# Patient Record
Sex: Male | Born: 1970 | Race: White | Hispanic: No | Marital: Married | Smoking: Never smoker
Health system: Southern US, Community
[De-identification: ages and names within clinical notes are randomized; demographics above are authoritative.]

## PROBLEM LIST (undated history)

## (undated) DIAGNOSIS — E785 Hyperlipidemia, unspecified: Secondary | ICD-10-CM

## (undated) DIAGNOSIS — K219 Gastro-esophageal reflux disease without esophagitis: Secondary | ICD-10-CM

## (undated) DIAGNOSIS — T7840XA Allergy, unspecified, initial encounter: Secondary | ICD-10-CM

## (undated) DIAGNOSIS — F32A Depression, unspecified: Secondary | ICD-10-CM

## (undated) DIAGNOSIS — M1990 Unspecified osteoarthritis, unspecified site: Secondary | ICD-10-CM

## (undated) DIAGNOSIS — K449 Diaphragmatic hernia without obstruction or gangrene: Secondary | ICD-10-CM

## (undated) DIAGNOSIS — K279 Peptic ulcer, site unspecified, unspecified as acute or chronic, without hemorrhage or perforation: Secondary | ICD-10-CM

## (undated) DIAGNOSIS — F419 Anxiety disorder, unspecified: Secondary | ICD-10-CM

## (undated) DIAGNOSIS — F329 Major depressive disorder, single episode, unspecified: Secondary | ICD-10-CM

## (undated) DIAGNOSIS — I10 Essential (primary) hypertension: Secondary | ICD-10-CM

## (undated) HISTORY — DX: Depression, unspecified: F32.A

## (undated) HISTORY — DX: Major depressive disorder, single episode, unspecified: F32.9

## (undated) HISTORY — DX: Allergy, unspecified, initial encounter: T78.40XA

## (undated) HISTORY — DX: Gastro-esophageal reflux disease without esophagitis: K21.9

## (undated) HISTORY — DX: Anxiety disorder, unspecified: F41.9

## (undated) HISTORY — PX: LIPOMA EXCISION: SHX5283

## (undated) HISTORY — PX: ESOPHAGOGASTRODUODENOSCOPY: SHX1529

## (undated) HISTORY — DX: Hyperlipidemia, unspecified: E78.5

---

## 2006-02-16 ENCOUNTER — Emergency Department: Payer: Self-pay | Admitting: Emergency Medicine

## 2006-03-01 ENCOUNTER — Emergency Department: Payer: Self-pay | Admitting: Unknown Physician Specialty

## 2006-03-01 ENCOUNTER — Other Ambulatory Visit: Payer: Self-pay

## 2006-08-09 ENCOUNTER — Other Ambulatory Visit: Payer: Self-pay

## 2006-08-09 ENCOUNTER — Emergency Department: Payer: Self-pay | Admitting: Emergency Medicine

## 2014-08-23 ENCOUNTER — Ambulatory Visit: Payer: Self-pay | Admitting: Family Medicine

## 2014-09-17 ENCOUNTER — Ambulatory Visit: Payer: Self-pay | Admitting: Family Medicine

## 2014-09-19 ENCOUNTER — Encounter: Payer: Self-pay | Admitting: Emergency Medicine

## 2014-09-19 ENCOUNTER — Emergency Department: Payer: Self-pay

## 2014-09-19 ENCOUNTER — Emergency Department
Admission: EM | Admit: 2014-09-19 | Discharge: 2014-09-19 | Disposition: A | Discharge status: Home / Self Care | Payer: Self-pay | Attending: Emergency Medicine | Admitting: Emergency Medicine

## 2014-09-19 DIAGNOSIS — R109 Unspecified abdominal pain: Secondary | ICD-10-CM

## 2014-09-19 DIAGNOSIS — X58XXXA Exposure to other specified factors, initial encounter: Secondary | ICD-10-CM | POA: Insufficient documentation

## 2014-09-19 DIAGNOSIS — Y9289 Other specified places as the place of occurrence of the external cause: Secondary | ICD-10-CM | POA: Insufficient documentation

## 2014-09-19 DIAGNOSIS — S39011A Strain of muscle, fascia and tendon of abdomen, initial encounter: Secondary | ICD-10-CM | POA: Insufficient documentation

## 2014-09-19 DIAGNOSIS — Y9389 Activity, other specified: Secondary | ICD-10-CM | POA: Insufficient documentation

## 2014-09-19 DIAGNOSIS — T148XXA Other injury of unspecified body region, initial encounter: Secondary | ICD-10-CM

## 2014-09-19 DIAGNOSIS — I10 Essential (primary) hypertension: Secondary | ICD-10-CM | POA: Insufficient documentation

## 2014-09-19 DIAGNOSIS — Y998 Other external cause status: Secondary | ICD-10-CM | POA: Insufficient documentation

## 2014-09-19 HISTORY — DX: Peptic ulcer, site unspecified, unspecified as acute or chronic, without hemorrhage or perforation: K27.9

## 2014-09-19 HISTORY — DX: Unspecified osteoarthritis, unspecified site: M19.90

## 2014-09-19 HISTORY — DX: Diaphragmatic hernia without obstruction or gangrene: K44.9

## 2014-09-19 HISTORY — DX: Essential (primary) hypertension: I10

## 2014-09-19 MED ORDER — CYCLOBENZAPRINE HCL 10 MG PO TABS: 10.0000 mg | ORAL_TABLET | Freq: Three times a day (TID) | ORAL | Status: DC | PRN

## 2014-09-19 NOTE — ED Notes (Signed)
NP at bedside.

## 2014-09-19 NOTE — ED Notes (Signed)
Pt reports sneezing about a week and a half ago and feeling a bad sharp pain in his L flank area radiating to his L lower abdomen.  Pain has persisted, but slightly better with ibuprofen.

## 2014-09-19 NOTE — Discharge Instructions (Signed)
Take medication as prescribed. Avoid overly strenuous activity. Stretch well. Limit NSAID use due to history of peptic ulcer. Alternate heat and ice for comfort.  Follow-up with your primary care physician this week as needed. Return to the ER immediately for new or worsening concerns as discussed.  Flank Pain Flank pain refers to pain that is located on the side of the body between the upper abdomen and the back. The pain may occur over a short period of time (acute) or may be long-term or reoccurring (chronic). It may be mild or severe. Flank pain can be caused by many things. CAUSES  Some of the more common causes of flank pain include:  Muscle strains.   Muscle spasms.   A disease of your spine (vertebral disk disease).   A lung infection (pneumonia).   Fluid around your lungs (pulmonary edema).   A kidney infection.   Kidney stones.   A very painful skin rash caused by the chickenpox virus (shingles).   Gallbladder disease.  HOME CARE INSTRUCTIONS  Home care will depend on the cause of your pain. In general,  Rest as directed by your caregiver.  Drink enough fluids to keep your urine clear or pale yellow.  Only take over-the-counter or prescription medicines as directed by your caregiver. Some medicines may help relieve the pain.  Tell your caregiver about any changes in your pain.  Follow up with your caregiver as directed. SEEK IMMEDIATE MEDICAL CARE IF:   Your pain is not controlled with medicine.   You have new or worsening symptoms.  Your pain increases.   You have abdominal pain.   You have shortness of breath.   You have persistent nausea or vomiting.   You have swelling in your abdomen.   You feel faint or pass out.   You have blood in your urine.  You have a fever or persistent symptoms for more than 2-3 days.  You have a fever and your symptoms suddenly get worse. MAKE SURE YOU:   Understand these instructions.  Will  watch your condition.  Will get help right away if you are not doing well or get worse. Document Released: 03/26/2005 Document Revised: 10/28/2011 Document Reviewed: 09/17/2011 [facility] Patient Information 2015 [place], [place]. This information is not intended to replace advice given to you by your health care provider. Make sure you discuss any questions you have with your health care provider.  Muscle Strain A muscle strain (pulled muscle) happens when a muscle is stretched beyond normal length. It happens when a sudden, violent force stretches your muscle too far. Usually, a few of the fibers in your muscle are torn. Muscle strain is common in athletes. Recovery usually takes 1-2 weeks. Complete healing takes 5-6 weeks.  HOME CARE   Follow the PRICE method of treatment to help your injury get better. Do this the first 2-3 days after the injury:  Protect. Protect the muscle to keep it from getting injured again.  Rest. Limit your activity and rest the injured body part.  Ice. Put ice in a plastic bag. Place a towel between your skin and the bag. Then, apply the ice and leave it on from 15-20 minutes each hour. After the third day, switch to moist heat packs.  Compression. Use a splint or elastic bandage on the injured area for comfort. Do not put it on too tightly.  Elevate. Keep the injured body part above the level of your heart.  Only take medicine as told by your  doctor.  Warm up before doing exercise to prevent future muscle strains. GET HELP IF:   You have more pain or puffiness (swelling) in the injured area.  You feel numbness, tingling, or notice a loss of strength in the injured area. MAKE SURE YOU:   Understand these instructions.  Will watch your condition.  Will get help right away if you are not doing well or get worse. Document Released: 11/12/2007 Document Revised: 11/23/2012 Document Reviewed: 09/01/2012 [facility] Patient Information 2015 [place], [place]. This  information is not intended to replace advice given to you by your health care provider. Make sure you discuss any questions you have with your health care provider.

## 2014-09-19 NOTE — ED Notes (Signed)
Patient verbalized understanding of discharge instructions given as well as prescription and need for follow up.

## 2014-09-19 NOTE — ED Provider Notes (Signed)
[facility] Emergency Department Provider Note  ____________________________________________  Time seen: Approximately 1:45 PM  I have reviewed the triage vital signs and the nursing notes.   HISTORY  Chief Complaint Flank Pain   HPI Steve Welch is a 44 y.o. male presents to the ER with spouse at bedside for the complaints of intermittent left flank pain 1 week. Patient reports that last Saturday he was sitting at the dinner table and sneezed. Patient states that he turned to his side to sneeze and states that he had a sudden onset of left flank pain at that time. Patient states that pain has been primarily with movement. Patient states that pain was resolving with over-the-counter ibuprofen. Patient states that yesterday morning he had no pain and that the pain has fully resolved but states that last night while at church he had a tickle in his throat and coughed back-to-back several times and had a re-onset of left flank pain.  Reports that current pain is 0 out of 10. Patient states the pain is only with movement. Denies fever, abdominal pain, constipation, diarrhea, dysuria, back pain, fall or injury. Patient states that when pain is present it is 3 out of 10 described as a sharp pain and catching. Denies pain radiation. States pain feels muscular.     Past Medical History  Diagnosis Date  . Hypertension   . Hiatal hernia   . Arthritis   . Peptic ulcer     There are no active problems to display for this patient.   History reviewed. No pertinent past surgical history.  Current Outpatient Rx  Name  Route  Sig  Dispense  Refill  .             Atenolol Sucralfate Omeprazole   Allergies Azithromycin  History reviewed. No pertinent family history.  Social History History  Substance Use Topics  . Smoking status: Never Smoker   . Smokeless tobacco: Not on file  . Alcohol Use: 0.6 oz/week    1 Cans of beer per week     Comment: once a week     Review of Systems Constitutional: No fever/chills Eyes: No visual changes. ENT: No sore throat. Cardiovascular: Denies chest pain. Respiratory: Denies shortness of breath. Gastrointestinal: No abdominal pain. Left flank pain with movement as above. No nausea, no vomiting.  No diarrhea.  No constipation. Genitourinary: Negative for dysuria. Musculoskeletal: Negative for back pain. Skin: Negative for rash. Neurological: Negative for headaches, focal weakness or numbness.  10-point ROS otherwise negative.  ____________________________________________   PHYSICAL EXAM:  VITAL SIGNS: ED Triage Vitals  Enc Vitals Group     BP 09/19/14 1214 143/89 mmHg     Pulse Rate 09/19/14 1214 78     Resp 09/19/14 1216 20     Temp 09/19/14 1214 98 F (36.7 C)     Temp Source 09/19/14 1214 Oral     SpO2 09/19/14 1214 96 %     Weight 09/19/14 1214 370 lb (167.831 kg)     Height 09/19/14 1214  (2.007 m)     Head Cir --      Peak Flow --      Pain Score 09/19/14 1215 4     Pain Loc --      Pain Edu? --      Excl. in GC? --     Constitutional: Alert and oriented. Well appearing and in no acute distress. Eyes: Conjunctivae are normal. PERRL. EOMI. Head: Atraumatic.  Nose:  No congestion/rhinnorhea.  Mouth/Throat: Mucous membranes are moist.  Oropharynx non-erythematous. Neck: No stridor.  No cervical spine tenderness to palpation. Hematological/Lymphatic/Immunilogical: No cervical lymphadenopathy. Cardiovascular: Normal rate, regular rhythm. Grossly normal heart sounds.  Good peripheral circulation. Respiratory: Normal respiratory effort.  No retractions. Lungs CTAB. Gastrointestinal: Soft and nontender. Obese abdomen. Normal Bowel sounds.  No abdominal bruits. No CVA tenderness.Left flank nontender to palpation. Left lateral flank mild pain with left arm overhead stretch and twisting movement. Pain only with movement.  Musculoskeletal: No lower or upper extremity tenderness nor  edema.  No joint effusions. Bilateral pedal pulses equal and easily palpated.  Neurologic:  Normal speech and language. No gross focal neurologic deficits are appreciated. No gait instability. Skin:  Skin is warm, dry and intact. No rash noted. Psychiatric: Mood and affect are normal. Speech and behavior are normal.  ____________________________________________   LABS (all labs ordered are listed, but only abnormal results are displayed)  Labs Reviewed - No data to display  RADIOLOGY  CHEST 2 VIEW  COMPARISON: None.  FINDINGS: Cardiopericardial silhouette within normal limits. Mediastinal contours normal. Trachea midline. No airspace disease or effusion. There is no pneumothorax. No displaced rib fractures are present.  IMPRESSION: No active cardiopulmonary disease.   Electronically Signed By: Andreas Newport M.D. On: 09/19/2014 13:12      I, Renford Dills, personally viewed and evaluated these images as part of my medical decision making.  ____________________________________________   INITIAL IMPRESSION / ASSESSMENT AND PLAN / ED COURSE  Pertinent labs & imaging results that were available during my care of the patient were reviewed by me and considered in my medical decision making (see chart for details).  Well-appearing patient. No acute distress. Presents the ER for complaints of 1 week times left flank pain. Patient reports that left flank pain had improved and fully resolved until last night he coughed several times back to back and had re-onset of left flank pain. Patient reports current left flank pain is 0 out of 10. Patient states that he only has left flank pain with movement. Patient abdomen soft and nontender. No CVA tenderness or posterior back pain. No pain on exam, except pain with twisting and left arm overhead stretching. Pain present only with movement.  Chest x-ray negative, no displaced rib fractures present. Discussed with patient and spouse  at bedside with him to evaluate urinalysis. Patient reports that he is on a fixed financial income without insurance and states that he does not want to give urine for testing. Patient alert and oriented with decisional capacity. Suspect muscle strain. Discussed very strict follow-up and return parameters. Patient and spouse verbalized understanding and agreed to plan. Will treat patient with when necessary Flexeril. Discussed importance of limiting NSAID use as history of peptic ulcer. Patient was understanding and agreed to plan. Verbalized understanding and return parameters. ____________________________________________   FINAL CLINICAL IMPRESSION(S) / ED DIAGNOSES  Final diagnoses:  Left flank pain  Muscle strain       Renford Dills, NP 09/19/14 1402  Darien Ramus, MD 09/19/14 [phone]

## 2015-04-04 ENCOUNTER — Telehealth: Payer: Self-pay | Admitting: Family Medicine

## 2015-04-04 NOTE — Telephone Encounter (Signed)
Please go ahead with referral to [facility].  The call back number is [phone]

## 2016-03-28 IMAGING — CR DG CHEST 2V
1 series · 2 of 2 positions shown · non-contrast
Comparison: None.

CLINICAL DATA: Rete cage pain. Fracture grade at her pain after
sneezing 1 week ago. LEFT flank pain radiating to the lower abdomen.

EXAM:
CHEST  2 VIEW

[Series 1: dg chest 2 view · 0.14mm/px · 2 of 2 slices shown]
[im 1/2]
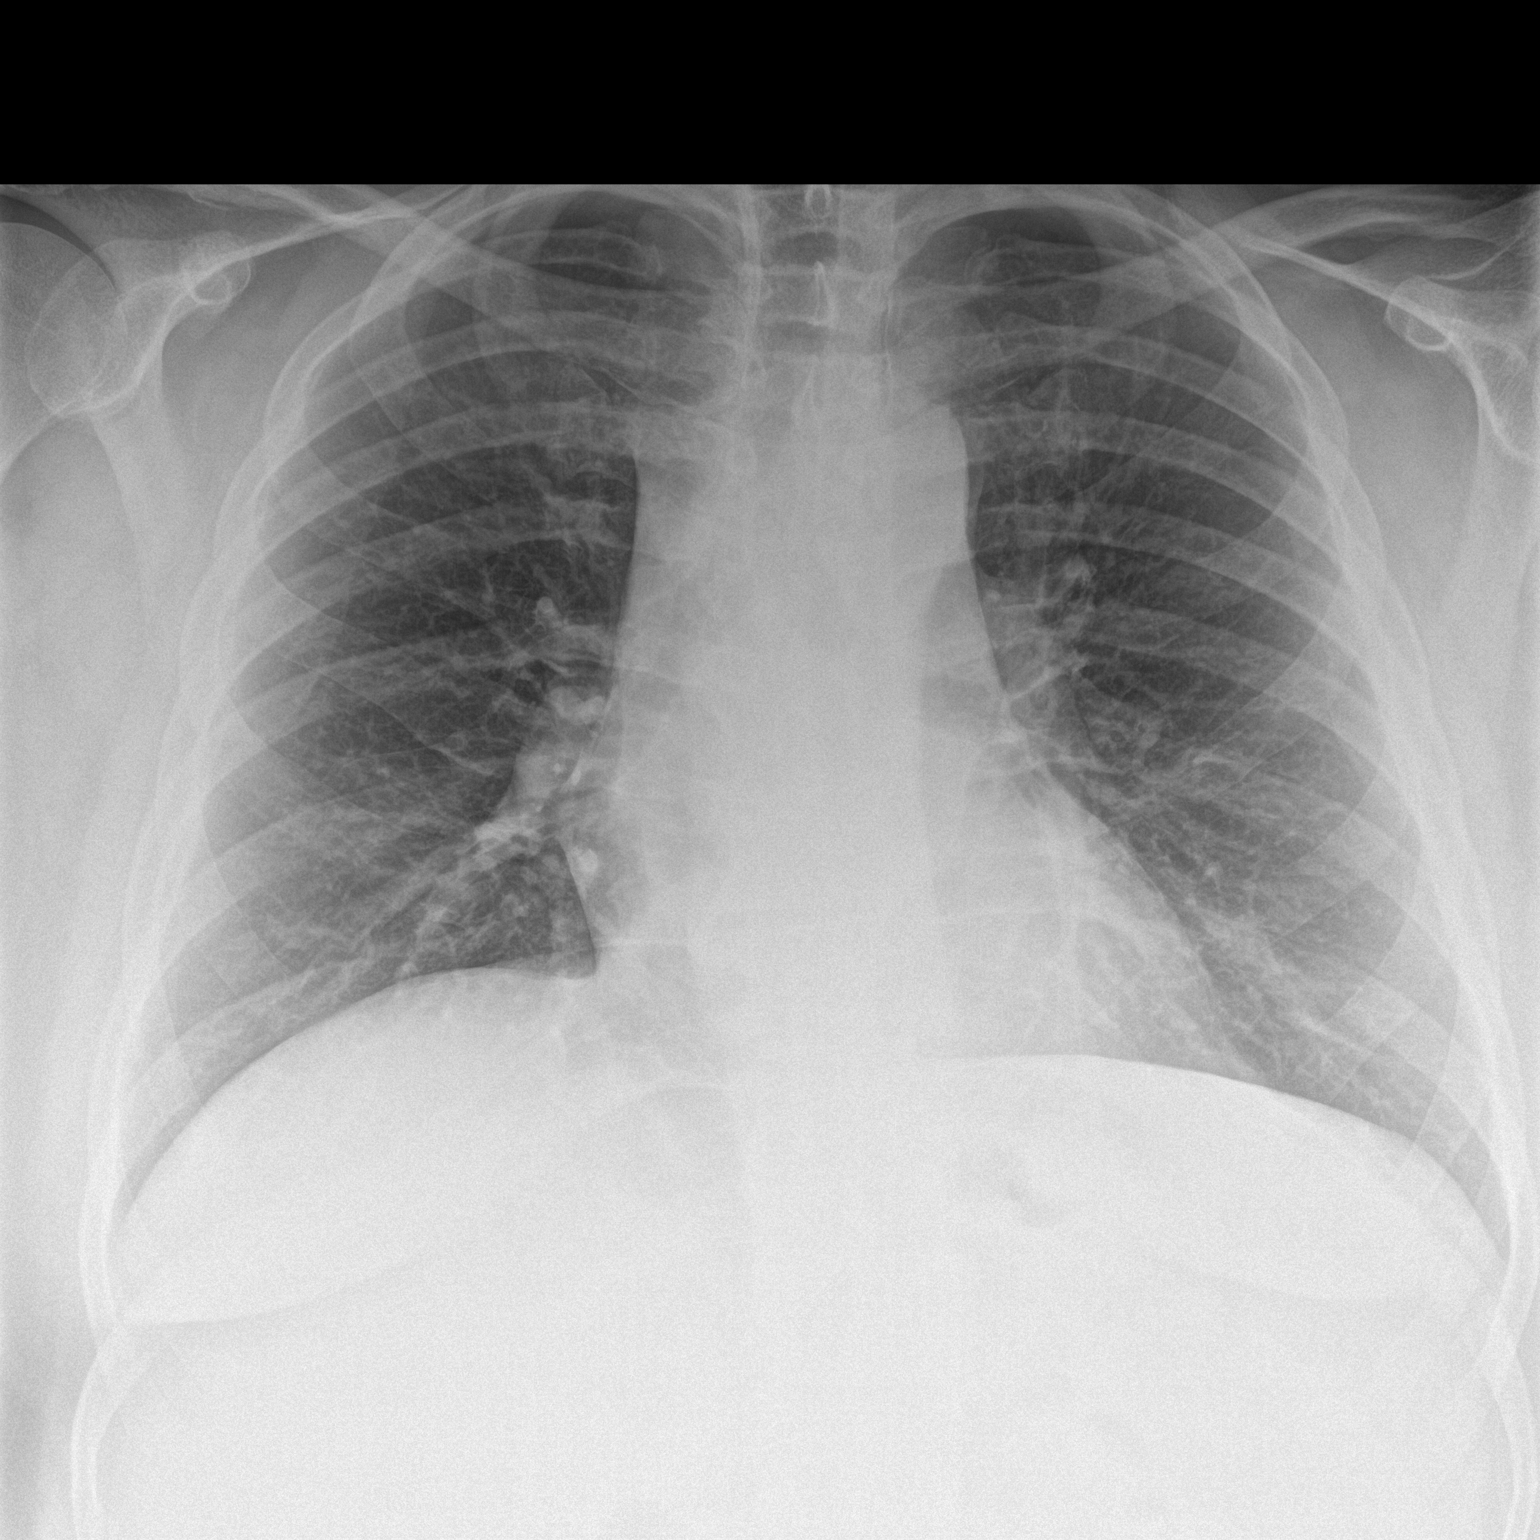
[im 2/2]
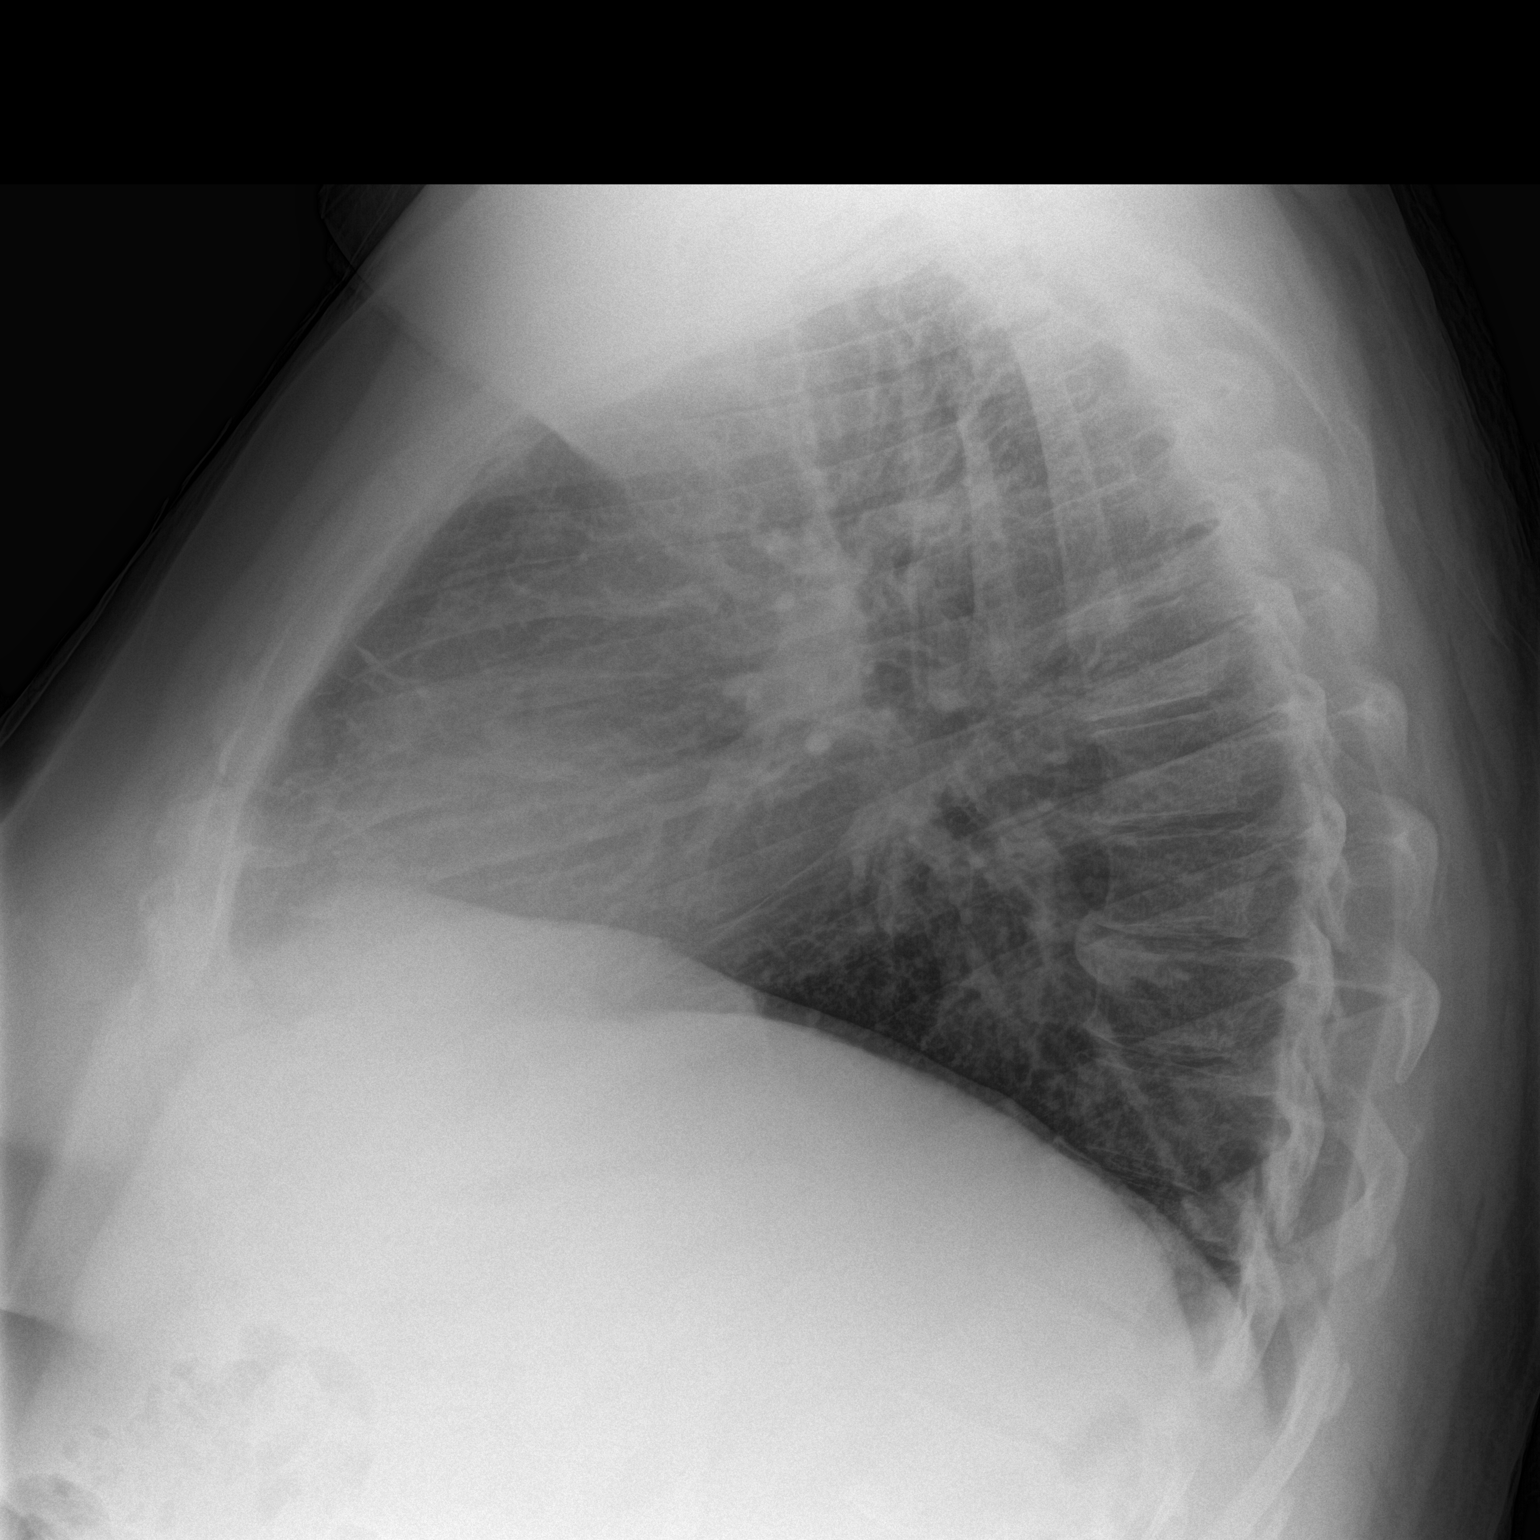

[2 of 2 positions shown; findings below may reference images not displayed]

FINDINGS: Cardiopericardial silhouette within normal limits. Mediastinal
contours normal. Trachea midline. No airspace disease or effusion.
There is no pneumothorax. No displaced rib fractures are present.
IMPRESSION: No active cardiopulmonary disease.

## 2016-06-19 ENCOUNTER — Ambulatory Visit (INDEPENDENT_AMBULATORY_CARE_PROVIDER_SITE_OTHER): Payer: Self-pay | Admitting: Nurse Practitioner

## 2016-06-19 ENCOUNTER — Encounter: Payer: Self-pay | Admitting: Nurse Practitioner

## 2016-06-19 VITALS — BP 141/81 | HR 92 | Temp 98.2°F | Resp 16 | Ht 78.0 in | Wt 339.6 lb

## 2016-06-19 DIAGNOSIS — F419 Anxiety disorder, unspecified: Secondary | ICD-10-CM

## 2016-06-19 DIAGNOSIS — M1990 Unspecified osteoarthritis, unspecified site: Secondary | ICD-10-CM

## 2016-06-19 DIAGNOSIS — F329 Major depressive disorder, single episode, unspecified: Secondary | ICD-10-CM | POA: Insufficient documentation

## 2016-06-19 DIAGNOSIS — T7840XA Allergy, unspecified, initial encounter: Secondary | ICD-10-CM | POA: Insufficient documentation

## 2016-06-19 DIAGNOSIS — F3341 Major depressive disorder, recurrent, in partial remission: Secondary | ICD-10-CM

## 2016-06-19 DIAGNOSIS — E785 Hyperlipidemia, unspecified: Secondary | ICD-10-CM | POA: Insufficient documentation

## 2016-06-19 DIAGNOSIS — K279 Peptic ulcer, site unspecified, unspecified as acute or chronic, without hemorrhage or perforation: Secondary | ICD-10-CM | POA: Insufficient documentation

## 2016-06-19 DIAGNOSIS — Z7689 Persons encountering health services in other specified circumstances: Secondary | ICD-10-CM

## 2016-06-19 DIAGNOSIS — K449 Diaphragmatic hernia without obstruction or gangrene: Secondary | ICD-10-CM | POA: Insufficient documentation

## 2016-06-19 DIAGNOSIS — I10 Essential (primary) hypertension: Secondary | ICD-10-CM

## 2016-06-19 DIAGNOSIS — M170 Bilateral primary osteoarthritis of knee: Secondary | ICD-10-CM | POA: Insufficient documentation

## 2016-06-19 DIAGNOSIS — K219 Gastro-esophageal reflux disease without esophagitis: Secondary | ICD-10-CM | POA: Insufficient documentation

## 2016-06-19 MED ORDER — ATENOLOL-CHLORTHALIDONE 100-25 MG PO TABS: 1.0000 | ORAL_TABLET | Freq: Every day | ORAL | 2 refills | Status: DC

## 2016-06-19 MED ORDER — BUSPIRONE HCL 5 MG PO TABS: 5.0000 mg | ORAL_TABLET | Freq: Two times a day (BID) | ORAL | 2 refills | Status: DC

## 2016-06-19 NOTE — Progress Notes (Signed)
Subjective:    Patient ID: Steve Welch, male    DOB: 09/28/1970, 46 y.o.   MRN: [id number]  Steve Welch is a 46 y.o. male presenting on 06/19/2016 for New Patient (Initial Visit) (Patient here to establish care, pt reports he is transferring care from [facility].)  He is accompanied today by his wife.  HPI  Hypertension Taking medications atenolol-chlorthalidone daily with no missed doses.  Usually 125-130/80 at home with home BP checks.  He gained significant weight when his mother passed away and turned to food for comfort.  He has lost about 120 lbs since that time by reducing portion sizes and reducing sugary drinks. He still drinks occasional soda 1-2 per week for a treat. Otherwise drinks flavored water or water.    Depression Draws and paints for emotional release.  Used zoloft when first diagnosed with depression. When painting is not sufficient, he "clams up into a shell and sleeps."  He is currently going through a significantly down period now.  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  Unable to hold a job.  Tried and couldn't.  "I can't provide for my family like I want to."  "I don't feel like a man anymore."  48 year old is autistic and high functioning (9-10 year).  He would love to play with him, but can't always go outside for sports because of his knee pain.  Knee pain He takes ibuprofen 400-600 mg 2-3 times daily and a glucosamine supplement with some improvement.  He doesn't notice regular relief of pain, but does have worsening pain when not taking either medication.  He also uses knee braces.  He feels unable to do anything like knee replacement surgery r/t being without health insurance.  States there are near constant grinding noises with movement and also noticing some grinding in his shoulders now.   Past Medical History:  Diagnosis Date  .  Allergy   . Anxiety   . Arthritis    knees and shoulders  . Depression   . GERD (gastroesophageal reflux disease)   . Hiatal hernia   . Hyperlipidemia   . Hypertension   . Peptic ulcer    gastric and duodenal   Past Surgical History:  Procedure Laterality Date  . ESOPHAGOGASTRODUODENOSCOPY    . LIPOMA EXCISION Left    near eye   Social History   Social History  . Marital status: Married    Spouse name: N/A  . Number of children: N/A  . Years of education: N/A   Occupational History  . Not on file.   Social History Main Topics  . Smoking status: Never Smoker  . Smokeless tobacco: Current User    Types: Snuff     Comment: started 18 x 2 years, 2010 1 can every 3 days  . Alcohol use No  . Drug use: No  . Sexual activity: Yes    Partners: Female    Birth control/ protection: None     Comment: tubal ligation   Other Topics Concern  . Not on file   Social History Narrative  . No narrative on file   Family History  Problem Relation Age of Onset  . Heart disease Mother     MI, HTN, HLD  . Diabetes Mother   . Depression Mother   . Heart disease Father     MI with  Stents  . Diabetes Maternal Grandmother   . Heart attack Paternal Grandmother   . Bladder Cancer Maternal Uncle   . Leukemia Maternal Aunt   . Colon cancer Neg Hx   . Prostate cancer Neg Hx   . Ovarian cancer Neg Hx   . Stroke Neg Hx     Depression screen [facility] 06/19/2016  Decreased Interest 1  Down, Depressed, Hopeless 1  PHQ - 2 Score 2  Altered sleeping 2  Tired, decreased energy 0  Change in appetite 1  Feeling bad or failure about yourself  3  Trouble concentrating 0  Moving slowly or fidgety/restless 1  Suicidal thoughts 0  PHQ-9 Score 9    Review of Systems Per HPI unless specifically indicated above      Objective:    BP (!) 141/81 (BP Location: Left Arm, Patient Position: Sitting, Cuff Size: Large)   Pulse 92   Temp 98.2 F (36.8 C) (Oral)   Resp 16   Ht 6\' 6"  (1.981  m)   Wt (!) 339 lb 9.6 oz (154 kg)   SpO2 97%   BMI 39.24 kg/m    BP recheck 124/78  Wt Readings from Last 3 Encounters:  06/19/16 (!) 339 lb 9.6 oz (154 kg)  09/19/14 (!) 370 lb (167.8 kg)    Physical Exam  Constitutional: He is oriented to person, place, and time. He appears well-developed and well-nourished.  significant body odor r/t poor hygiene  HENT:  Head: Normocephalic and atraumatic.  Right Ear: External ear normal.  Left Ear: External ear normal.  Mouth/Throat: Oropharynx is clear and moist.  Eyes: Conjunctivae and EOM are normal. Pupils are equal, round, and reactive to light.  Neck: Normal range of motion. No JVD present. No tracheal deviation present.  No carotid bruits  Cardiovascular: Normal rate, regular rhythm, normal heart sounds and intact distal pulses.   Pulmonary/Chest: Effort normal and breath sounds normal. No respiratory distress. He has no wheezes. He has no rales.  Abdominal: Soft. He exhibits no distension. There is no tenderness.  No hepatosplenomegaly noted with scratch test  Musculoskeletal:  Crepitus noted bilateral knees.  Pain with standing.  Difficulty/inability to climb step to get on exam table.  Lymphadenopathy:    He has no cervical adenopathy.  Neurological: He is alert and oriented to person, place, and time. He has normal reflexes.  Skin: Skin is warm. He is not diaphoretic.  Psychiatric: He has a normal mood and affect. His behavior is normal. Judgment and thought content normal. He expresses no homicidal and no suicidal ideation. He expresses no suicidal plans and no homicidal plans.   No results found for this or any previous visit.    Assessment & Plan:   Problem List Items Addressed This Visit      Cardiovascular and Mediastinum   Hypertension Stable on medications.  Tolerating well.  Requests most affordable medications available if possible.  Plan: - Continue atenolol-chlorthalidone 100-25 mg tablet take one tablet once  daily. - Obtain CMP and Lipid panel fasting labs.  Evaluate kidney function and other common comorbidities   Relevant Medications   atenolol-chlorthalidone (TENORETIC) 100-25 MG tablet   Other Relevant Orders   COMPLETE METABOLIC PANEL WITH GFR   Lipid panel     Musculoskeletal and Integument   Arthritis Severe but stable course.  Pt unable to seek additional care for knee replacements at this time.    Plan: - Check CMP for kidney function r/t chronic daily use of  max doses ibuprofen. - No additional workup at this time desired by patient.  Limited by finances.   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  Still impacting daily life.   Plan: - START buspar 5 mg once daily and increasing to BID dosing after 1 week. - Continue painting/drawing for stress relief. - Encouraged to find purpose for something in life and with son despite changing roles and inability to provide financially for his family.   Relevant Medications   busPIRone (BUSPAR) 5 MG tablet    Other Visit Diagnoses    Establishing care with new doctor, encounter for     Obtain records from former PCP at [facility]       Meds ordered this encounter  Medications  . Multiple Vitamin (MULTIVITAMIN) tablet    Sig: Take 1 tablet by mouth daily.  . Ascorbic Acid (VITAMIN C) 100 MG tablet    Sig: Take 100 mg by mouth daily.  . vitamin B-12 (CYANOCOBALAMIN) 100 MCG tablet    Sig: Take 100 mcg by mouth daily.  . vitamin E 100 UNIT capsule    Sig: Take 100 Units by mouth daily.  . Glucosamine Sulfate 1000 MG CAPS    Sig: Take by mouth.  . cetirizine (ZYRTEC) 10 MG tablet    Sig: Take 10 mg by mouth daily.  Marland Kitchen omeprazole (PRILOSEC) 20 MG capsule    Sig: Take 20 mg by mouth 2 (two) times daily before a meal.   . DISCONTD: atenolol-chlorthalidone (TENORETIC) 100-25 MG  tablet    Sig: Take 1 tablet by mouth daily.  . busPIRone (BUSPAR) 5 MG tablet    Sig: Take 1 tablet (5 mg total) by mouth 2 (two) times daily.    Dispense:  60 tablet    Refill:  2    Order Specific Question:   Supervising Provider    Answer:   Smitty Cords [[id number]]  . atenolol-chlorthalidone (TENORETIC) 100-25 MG tablet    Sig: Take 1 tablet by mouth daily.    Dispense:  30 tablet    Refill:  2    Order Specific Question:   Supervising Provider    Answer:   Smitty Cords [[id number]]      Follow up plan: Return in about 3 months (around 09/19/2016), or and as needed if new depression medications are not helping.  Wilhelmina Mcardle, DNP, AGPCNP-BC Adult Gerontology Primary Care Nurse Practitioner [facility] 06/20/2016, 9:37 PM

## 2016-06-19 NOTE — Patient Instructions (Addendum)
Steve Welch, Thank you for coming in to clinic today.  1. For your blood pressure: - Continue your current medication -   2. For your knee pain: - Start taking Tylenol extra strength 1 to 2 tablets every 8 hours for aches as needed.  Do not take more than 3,000 mg in 24 hours from all medicines.  May take Ibuprofen as well if tolerated 200-400mg  every 8 hours as needed. - Use heat and ice.  Apply this for 15 minutes at a time 6-8 times per day.   - Muscle rub with lidocaine.  Avoid using this with heat and ice to avoid burns.   3. For your depression: - CONTINUE your painting/drawing. - START buspar 5 mg once daily.  Increase to one pill twice daily after 7 days. - If there is any suicidal ideation, seek emergency care.    Please schedule a follow-up appointment with Wilhelmina Mcardle, AGNP in 3 months.  If you have any other questions or concerns, please feel free to call the clinic or send a message through MyChart. You may also schedule an earlier appointment if necessary.  Wilhelmina Mcardle, DNP, AGNP-BC Adult Gerontology Nurse Practitioner [facility], [facility]   Buspirone tablets What is this medicine? BUSPIRONE (byoo SPYE rone) is used to treat anxiety disorders. This medicine may be used for other purposes; ask your health care provider or pharmacist if you have questions. COMMON BRAND NAME(S): BuSpar What should I tell my health care provider before I take this medicine? They need to know if you have any of these conditions: -kidney or liver disease -an unusual or allergic reaction to buspirone, other medicines, foods, dyes, or preservatives -pregnant or trying to get pregnant -breast-feeding How should I use this medicine? Take this medicine by mouth with a glass of water. Follow the directions on the prescription label. You may take this medicine with or without food. To ensure that this medicine always works the same way for you, you should take it either always with  or always without food. Take your doses at regular intervals. Do not take your medicine more often than directed. Do not stop taking except on the advice of your doctor or health care professional. Talk to your pediatrician regarding the use of this medicine in children. Special care may be needed. Overdosage: If you think you have taken too much of this medicine contact a poison control center or emergency room at once. NOTE: This medicine is only for you. Do not share this medicine with others. What if I miss a dose? If you miss a dose, take it as soon as you can. If it is almost time for your next dose, take only that dose. Do not take double or extra doses. What may interact with this medicine? Do not take this medicine with any of the following medications: -linezolid -MAOIs like Carbex, Eldepryl, Marplan, Nardil, and Parnate -methylene blue -procarbazine This medicine may also interact with the following medications: -diazepam -digoxin -diltiazem -erythromycin -grapefruit juice -haloperidol -medicines for mental depression or mood problems -medicines for seizures like carbamazepine, phenobarbital and phenytoin -nefazodone -other medications for anxiety -rifampin -ritonavir -some antifungal medicines like itraconazole, ketoconazole, and voriconazole -verapamil -warfarin This list may not describe all possible interactions. Give your health care provider a list of all the medicines, herbs, non-prescription drugs, or dietary supplements you use. Also tell them if you smoke, drink alcohol, or use illegal drugs. Some items may interact with your medicine. What should I watch for  while using this medicine? Visit your doctor or health care professional for regular checks on your progress. It may take 1 to 2 weeks before your anxiety gets better. You may get drowsy or dizzy. Do not drive, use machinery, or do anything that needs mental alertness until you know how this drug affects you.  Do not stand or sit up quickly, especially if you are an older patient. This reduces the risk of dizzy or fainting spells. Alcohol can make you more drowsy and dizzy. Avoid alcoholic drinks. What side effects may I notice from receiving this medicine? Side effects that you should report to your doctor or health care professional as soon as possible: -blurred vision or other vision changes -chest pain -confusion -difficulty breathing -feelings of hostility or anger -muscle aches and pains -numbness or tingling in hands or feet -ringing in the ears -skin rash and itching -vomiting -weakness Side effects that usually do not require medical attention (report to your doctor or health care professional if they continue or are bothersome): -disturbed dreams, nightmares -headache -nausea -restlessness or nervousness -sore throat and nasal congestion -stomach upset This list may not describe all possible side effects. Call your doctor for medical advice about side effects. You may report side effects to FDA at 1-800-FDA-1088. Where should I keep my medicine? Keep out of the reach of children. Store at room temperature below 30 degrees C (86 degrees F). Protect from light. Keep container tightly closed. Throw away any unused medicine after the expiration date. NOTE: This sheet is a summary. It may not cover all possible information. If you have questions about this medicine, talk to your doctor, pharmacist, or health care provider.  2018 Elsevier/Gold Standard (2009-09-12 18:06:11)

## 2016-06-21 NOTE — Progress Notes (Signed)
I have reviewed this encounter including the documentation in this note and/or discussed this patient with the provider, Wilhelmina McardleLauren Kennedy, AGPCNP-BC. I am certifying that I agree with the content of this note as supervising physician.  Saralyn PilarAlexander Randal Yepiz, DO [facility] 06/21/2016, 9:58 AM

## 2016-09-08 ENCOUNTER — Other Ambulatory Visit: Payer: Self-pay | Admitting: Nurse Practitioner

## 2016-09-08 DIAGNOSIS — I10 Essential (primary) hypertension: Secondary | ICD-10-CM

## 2016-12-07 ENCOUNTER — Telehealth: Payer: Self-pay | Admitting: Nurse Practitioner

## 2016-12-07 DIAGNOSIS — I10 Essential (primary) hypertension: Secondary | ICD-10-CM

## 2016-12-07 MED ORDER — ATENOLOL-CHLORTHALIDONE 100-25 MG PO TABS: 1.0000 | ORAL_TABLET | Freq: Every day | ORAL | 0 refills | Status: DC

## 2016-12-07 NOTE — Telephone Encounter (Signed)
Pt [facility] [address] [facility]  He doesn't have money for copay this month but did schedule follow up for 11/29.  Her said his BP has been running around 125/80.  His call back number is [phone]

## 2017-01-14 ENCOUNTER — Ambulatory Visit: Payer: Self-pay | Admitting: Nurse Practitioner

## 2017-02-25 ENCOUNTER — Encounter: Payer: Self-pay | Admitting: Nurse Practitioner

## 2017-02-25 ENCOUNTER — Other Ambulatory Visit: Payer: Self-pay

## 2017-02-25 ENCOUNTER — Ambulatory Visit: Payer: Self-pay | Admitting: Nurse Practitioner

## 2017-02-25 DIAGNOSIS — Z6839 Body mass index (BMI) 39.0-39.9, adult: Secondary | ICD-10-CM

## 2017-02-25 DIAGNOSIS — I10 Essential (primary) hypertension: Secondary | ICD-10-CM

## 2017-02-25 MED ORDER — ATENOLOL-CHLORTHALIDONE 100-25 MG PO TABS: 1.0000 | ORAL_TABLET | Freq: Every day | ORAL | 0 refills | Status: DC

## 2017-02-25 NOTE — Patient Instructions (Addendum)
Steve Welch, Thank you for coming in to clinic today.  1. For your labs, we need to check kidney function.  2. Increase your physical activity until you are increasing your heart rate for 30 minutes on most days of the week.  DASH Eating Plan DASH stands for "Dietary Approaches to Stop Hypertension." The DASH eating plan is a healthy eating plan that has been shown to reduce high blood pressure (hypertension). It may also reduce your risk for type 2 diabetes, heart disease, and stroke. The DASH eating plan may also help with weight loss. What are tips for following this plan? General guidelines  Avoid eating more than 2,300 mg (milligrams) of salt (sodium) a day. If you have hypertension, you may need to reduce your sodium intake to 1,500 mg a day.  Limit alcohol intake to no more than 1 drink a day for nonpregnant women and 2 drinks a day for men. One drink equals 12 oz of beer, 5 oz of wine, or 1 oz of hard liquor.  Work with your health care provider to maintain a healthy body weight or to lose weight. Ask what an ideal weight is for you.  Get at least 30 minutes of exercise that causes your heart to beat faster (aerobic exercise) most days of the week. Activities may include walking, swimming, or biking.  Work with your health care provider or diet and nutrition specialist (dietitian) to adjust your eating plan to your individual calorie needs. Reading food labels  Check food labels for the amount of sodium per serving. Choose foods with less than 5 percent of the Daily Value of sodium. Generally, foods with less than 300 mg of sodium per serving fit into this eating plan.  To find whole grains, look for the word "whole" as the first word in the ingredient list. Shopping  Buy products labeled as "low-sodium" or "no salt added."  Buy fresh foods. Avoid canned foods and premade or frozen meals. Cooking  Avoid adding salt when cooking. Use salt-free seasonings or herbs instead of table  salt or sea salt. Check with your health care provider or pharmacist before using salt substitutes.  Do not fry foods. Cook foods using healthy methods such as baking, boiling, grilling, and broiling instead.  Cook with heart-healthy oils, such as olive, canola, soybean, or sunflower oil. Meal planning   Eat a balanced diet that includes: ? 5 or more servings of fruits and vegetables each day. At each meal, try to fill half of your plate with fruits and vegetables. ? Up to 6-8 servings of whole grains each day. ? Less than 6 oz of lean meat, poultry, or fish each day. A 3-oz serving of meat is about the same size as a deck of cards. One egg equals 1 oz. ? 2 servings of low-fat dairy each day. ? A serving of nuts, seeds, or beans 5 times each week. ? Heart-healthy fats. Healthy fats called Omega-3 fatty acids are found in foods such as flaxseeds and coldwater fish, like sardines, salmon, and mackerel.  Limit how much you eat of the following: ? Canned or prepackaged foods. ? Food that is high in trans fat, such as fried foods. ? Food that is high in saturated fat, such as fatty meat. ? Sweets, desserts, sugary drinks, and other foods with added sugar. ? Full-fat dairy products.  Do not salt foods before eating.  Try to eat at least 2 vegetarian meals each week.  Eat more home-cooked food and less restaurant,  buffet, and fast food.  When eating at a restaurant, ask that your food be prepared with less salt or no salt, if possible. What foods are recommended? The items listed may not be a complete list. Talk with your dietitian about what dietary choices are best for you. Grains Whole-grain or whole-wheat bread. Whole-grain or whole-wheat pasta. Brown rice. Modena Morrow. Bulgur. Whole-grain and low-sodium cereals. Pita bread. Low-fat, low-sodium crackers. Whole-wheat flour tortillas. Vegetables Fresh or frozen vegetables (raw, steamed, roasted, or grilled). Low-sodium or  reduced-sodium tomato and vegetable juice. Low-sodium or reduced-sodium tomato sauce and tomato paste. Low-sodium or reduced-sodium canned vegetables. Fruits All fresh, dried, or frozen fruit. Canned fruit in natural juice (without added sugar). Meat and other protein foods Skinless chicken or [place]. Ground chicken or [place]. Pork with fat trimmed off. Fish and seafood. Egg whites. Dried beans, peas, or lentils. Unsalted nuts, nut butters, and seeds. Unsalted canned beans. Lean cuts of beef with fat trimmed off. Low-sodium, lean deli meat. Dairy Low-fat (1%) or fat-free (skim) milk. Fat-free, low-fat, or reduced-fat cheeses. Nonfat, low-sodium ricotta or cottage cheese. Low-fat or nonfat yogurt. Low-fat, low-sodium cheese. Fats and oils Soft margarine without trans fats. Vegetable oil. Low-fat, reduced-fat, or light mayonnaise and salad dressings (reduced-sodium). Canola, safflower, olive, soybean, and sunflower oils. Avocado. Seasoning and other foods Herbs. Spices. Seasoning mixes without salt. Unsalted popcorn and pretzels. Fat-free sweets. What foods are not recommended? The items listed may not be a complete list. Talk with your dietitian about what dietary choices are best for you. Grains Baked goods made with fat, such as croissants, muffins, or some breads. Dry pasta or rice meal packs. Vegetables Creamed or fried vegetables. Vegetables in a cheese sauce. Regular canned vegetables (not low-sodium or reduced-sodium). Regular canned tomato sauce and paste (not low-sodium or reduced-sodium). Regular tomato and vegetable juice (not low-sodium or reduced-sodium). Angie Fava. Olives. Fruits Canned fruit in a light or heavy syrup. Fried fruit. Fruit in cream or butter sauce. Meat and other protein foods Fatty cuts of meat. Ribs. Fried meat. Berniece Salines. Sausage. Bologna and other processed lunch meats. Salami. Fatback. Hotdogs. Bratwurst. Salted nuts and seeds. Canned beans with added salt. Canned or  smoked fish. Whole eggs or egg yolks. Chicken or [place] with skin. Dairy Whole or 2% milk, cream, and half-and-half. Whole or full-fat cream cheese. Whole-fat or sweetened yogurt. Full-fat cheese. Nondairy creamers. Whipped toppings. Processed cheese and cheese spreads. Fats and oils Butter. Stick margarine. Lard. Shortening. Ghee. Bacon fat. Tropical oils, such as coconut, palm kernel, or palm oil. Seasoning and other foods Salted popcorn and pretzels. Onion salt, garlic salt, seasoned salt, table salt, and sea salt. Worcestershire sauce. Tartar sauce. Barbecue sauce. Teriyaki sauce. Soy sauce, including reduced-sodium. Steak sauce. Canned and packaged gravies. Fish sauce. Oyster sauce. Cocktail sauce. Horseradish that you find on the shelf. Ketchup. Mustard. Meat flavorings and tenderizers. Bouillon cubes. Hot sauce and Tabasco sauce. Premade or packaged marinades. Premade or packaged taco seasonings. Relishes. Regular salad dressings. Where to find more information:  National Heart, Lung, and [place]: https://wilson-eaton.com/  American Heart Association: www.heart.org Summary  The DASH eating plan is a healthy eating plan that has been shown to reduce high blood pressure (hypertension). It may also reduce your risk for type 2 diabetes, heart disease, and stroke.  With the DASH eating plan, you should limit salt (sodium) intake to 2,300 mg a day. If you have hypertension, you may need to reduce your sodium intake to 1,500 mg a day.  When  on the DASH eating plan, aim to eat more fresh fruits and vegetables, whole grains, lean proteins, low-fat dairy, and heart-healthy fats.  Work with your health care provider or diet and nutrition specialist (dietitian) to adjust your eating plan to your individual calorie needs. This information is not intended to replace advice given to you by your health care provider. Make sure you discuss any questions you have with your health care provider. Document  Released: 01/22/2011 Document Revised: 01/27/2016 Document Reviewed: 01/27/2016 Elsevier Interactive Patient Education  2018 Elsevier Inc.    Please schedule a follow-up appointment with Wilhelmina McardleLauren Sandeep Delagarza, AGNP. Return in about 6 months (around 08/25/2017) for Hypertension and in the next 7-10 days for labs.  If you have any other questions or concerns, please feel free to call the clinic or send a message through MyChart. You may also schedule an earlier appointment if necessary.  You will receive a survey after today's visit either digitally by e-mail or paper by [employer] mail. Your experiences and feedback matter to us.  Please respond so we know how we are doing as we provide care for you.   Wilhelmina McardleLauren Cederic Mozley, DNP, AGNP-BC Adult Gerontology Nurse Practitioner [facility], [facility]

## 2017-02-25 NOTE — Progress Notes (Signed)
Subjective:    Patient ID: Steve Welch, male    DOB: 07-Apr-1970, 47 y.o.   MRN: [id number]  Steve Welch is a 47 y.o. male presenting on 02/25/2017 for Hypertension   HPI Hypertension - He is checking BP at home or outside of clinic.  Readings around 130/80  - Current medications: atenolol-chlorthalidone 100-25 mg once daily, tolerating well without side effects - He is not currently symptomatic. - Pt denies headache, lightheadedness, dizziness, changes in vision, chest tightness/pressure, palpitations, leg swelling, sudden loss of speech or loss of consciousness.  Pt did not follow up with obtaining labs as ordered after last visit.  Cites significant financial limitations.  Class 2 Obesity Pt states he has lost weight since last visit, but review of weights shows 4 lb weight gain.  Pt reports indulgence over holidays, but stable weight since May.  Pt remembers weight loss from 2016 to now. - He  reports no regular exercise routine. Limitation related to knee pain and back pain.   - His diet is moderate in salt, moderate in fat, and moderate in carbohydrates.  Typical Meal intake: (Pt reports is about 50% what he used to eat) Breakfast: breakfast burrito, grilled cheese, or biscuit Lunch: Ham sandwich or ramen soup Dinner: Meat, 2 veg, bread, sweet tea  Social History   Tobacco Use  . Smoking status: Never Smoker  . Smokeless tobacco: Current User    Types: Snuff  . Tobacco comment: started 18 x 2 years, 2010 1 can every 3 days  Substance Use Topics  . Alcohol use: No  . Drug use: No    Review of Systems Per HPI unless specifically indicated above     Objective:    BP 134/80 (BP Location: Right Arm, Patient Position: Sitting, Cuff Size: Large)   Pulse 91   Temp 98.2 F (36.8 C) (Oral)   Ht 6\' 6"  (1.981 m)   Wt (!) 343 lb (155.6 kg)   BMI 39.64 kg/m   Wt Readings from Last 3 Encounters:  02/25/17 (!) 343 lb (155.6 kg)  06/19/16 (!) 339 lb 9.6 oz (154 kg)    09/19/14 (!) 370 lb (167.8 kg)    Physical Exam  General - Class 2 obesity with central adiposity, well-appearing, NAD HEENT - Normocephalic, atraumatic Neck - supple, non-tender, no LAD Heart - RRR, no murmurs heard Lungs - Clear throughout all lobes, no wheezing, crackles, or rhonchi. Normal work of breathing. Extremeties - non-tender, +1 pedal edema, cap refill < 2 seconds, peripheral pulses intact +2 bilaterally Skin - warm, dry Neuro - awake, alert, oriented x3, antalgic gait Psych - Normal mood and affect, normal behavior      Assessment & Plan:   Problem List Items Addressed This Visit      Cardiovascular and Mediastinum   Hypertension    Currently controlled hypertension on medications.  BP at goal.  Pt has begun to work on limiting oral intake, but is not physically active r/t osteoarthritis limitations.  Taking medications tolerating well without side effects.  - Complicating factors: obesity - No other known complications, but no additional labs obtained by pt to determine if any complications exist.  Plan: 1. Continue taking atenolol-chlorthalidone 100-25 mg once daily.  Provided only 30 days medicine r/t no labs available in last 5+ years. 2. Obtain labs BMP (only essential lab that must be collected).  3. Encouraged heart healthy diet and increasing exercise slowly to 30 minutes most days of the week. 4. Check BP 1-2  x per week at home, keep log, and bring to clinic at next appointment. 5. Follow up 6 months.        Relevant Medications   atenolol-chlorthalidone (TENORETIC) 100-25 MG tablet   Other Relevant Orders   BASIC METABOLIC PANEL WITH GFR      Meds ordered this encounter  Medications  . atenolol-chlorthalidone (TENORETIC) 100-25 MG tablet    Sig: Take 1 tablet by mouth daily.    Dispense:  30 tablet    Refill:  0    Order Specific Question:   Supervising Provider    Answer:   Smitty CordsKARAMALEGOS, ALEXANDER J [[id number]]     Follow up plan: Return in about  6 months (around 08/25/2017) for Hypertension and in the next 7-10 days for labs.  Wilhelmina McardleLauren Kori Colin, DNP, AGPCNP-BC Adult Gerontology Primary Care Nurse Practitioner [facility] 02/25/2017, 8:52 AM

## 2017-02-25 NOTE — Assessment & Plan Note (Signed)
Currently controlled hypertension on medications.  BP at goal.  Pt has begun to work on limiting oral intake, but is not physically active r/t osteoarthritis limitations.  Taking medications tolerating well without side effects.  - Complicating factors: obesity - No other known complications, but no additional labs obtained by pt to determine if any complications exist.  Plan: 1. Continue taking atenolol-chlorthalidone 100-25 mg once daily.  Provided only 30 days medicine r/t no labs available in last 5+ years. 2. Obtain labs BMP (only essential lab that must be collected).  3. Encouraged heart healthy diet and increasing exercise slowly to 30 minutes most days of the week. 4. Check BP 1-2 x per week at home, keep log, and bring to clinic at next appointment. 5. Follow up 6 months.

## 2017-03-11 ENCOUNTER — Other Ambulatory Visit: Payer: Self-pay

## 2017-03-12 ENCOUNTER — Other Ambulatory Visit: Payer: Self-pay

## 2017-04-15 ENCOUNTER — Other Ambulatory Visit: Payer: Self-pay | Admitting: Nurse Practitioner

## 2017-04-15 ENCOUNTER — Telehealth: Payer: Self-pay | Admitting: Family Medicine

## 2017-04-15 DIAGNOSIS — I10 Essential (primary) hypertension: Secondary | ICD-10-CM

## 2017-04-15 NOTE — Telephone Encounter (Signed)
[facility] [address]  His call back number is [phone]

## 2017-05-14 ENCOUNTER — Telehealth: Payer: Self-pay | Admitting: Nurse Practitioner

## 2017-05-14 ENCOUNTER — Other Ambulatory Visit: Payer: Self-pay

## 2017-05-14 DIAGNOSIS — I10 Essential (primary) hypertension: Secondary | ICD-10-CM

## 2017-05-14 MED ORDER — ATENOLOL-CHLORTHALIDONE 100-25 MG PO TABS: 1.0000 | ORAL_TABLET | Freq: Every day | ORAL | 0 refills | Status: DC

## 2017-05-14 NOTE — Telephone Encounter (Signed)
Pt needs a refill on BP medication.  He asked to hold off on lab work because he recently applied for medicaid and his wife will start a jog in mid April.Steve Welch.  His call back number is [phone]

## 2017-06-14 ENCOUNTER — Other Ambulatory Visit: Payer: Self-pay | Admitting: Nurse Practitioner

## 2017-06-14 DIAGNOSIS — I10 Essential (primary) hypertension: Secondary | ICD-10-CM

## 2017-06-14 MED ORDER — ATENOLOL-CHLORTHALIDONE 100-25 MG PO TABS: 1.0000 | ORAL_TABLET | Freq: Every day | ORAL | 0 refills | Status: DC

## 2017-06-14 NOTE — Telephone Encounter (Signed)
Pt. Wife called request refill on  Atenolol chlorthalidone Pt. Call back # is [phone]

## 2017-08-26 ENCOUNTER — Ambulatory Visit: Payer: Self-pay | Admitting: Nurse Practitioner

## 2017-12-08 ENCOUNTER — Other Ambulatory Visit: Payer: Self-pay | Admitting: Nurse Practitioner

## 2017-12-08 DIAGNOSIS — I10 Essential (primary) hypertension: Secondary | ICD-10-CM

## 2017-12-08 NOTE — Telephone Encounter (Signed)
Pt needs a refill on atenolol sent to [facility] [address]

## 2017-12-09 MED ORDER — ATENOLOL-CHLORTHALIDONE 100-25 MG PO TABS: 1.0000 | ORAL_TABLET | Freq: Every day | ORAL | 0 refills | Status: DC

## 2017-12-16 ENCOUNTER — Ambulatory Visit: Payer: Medicaid Other | Admitting: Nurse Practitioner

## 2018-03-24 ENCOUNTER — Ambulatory Visit: Payer: Self-pay | Admitting: Nurse Practitioner

## 2018-03-24 ENCOUNTER — Encounter: Payer: Self-pay | Admitting: Nurse Practitioner

## 2018-03-24 ENCOUNTER — Other Ambulatory Visit: Payer: Self-pay

## 2018-03-24 VITALS — BP 123/84 | HR 89 | Temp 97.8°F | Ht 78.0 in | Wt 340.5 lb

## 2018-03-24 DIAGNOSIS — M172 Bilateral post-traumatic osteoarthritis of knee: Secondary | ICD-10-CM

## 2018-03-24 DIAGNOSIS — N529 Male erectile dysfunction, unspecified: Secondary | ICD-10-CM | POA: Insufficient documentation

## 2018-03-24 DIAGNOSIS — F419 Anxiety disorder, unspecified: Secondary | ICD-10-CM

## 2018-03-24 DIAGNOSIS — Z6839 Body mass index (BMI) 39.0-39.9, adult: Secondary | ICD-10-CM

## 2018-03-24 DIAGNOSIS — I10 Essential (primary) hypertension: Secondary | ICD-10-CM

## 2018-03-24 MED ORDER — ATENOLOL-CHLORTHALIDONE 100-25 MG PO TABS
1.0000 | ORAL_TABLET | Freq: Every day | ORAL | 5 refills | Status: DC
Start: 2018-03-24 — End: 2018-09-09

## 2018-03-24 MED ORDER — BUSPIRONE HCL 5 MG PO TABS
5.0000 mg | ORAL_TABLET | Freq: Two times a day (BID) | ORAL | 5 refills | Status: DC
Start: 2018-03-24 — End: 2018-09-09

## 2018-03-24 MED ORDER — MELOXICAM 15 MG PO TABS: 15.0000 mg | ORAL_TABLET | Freq: Every day | ORAL | 0 refills | Status: DC

## 2018-03-24 NOTE — Progress Notes (Signed)
Subjective:    Patient ID: Steve Welch, male    DOB: [birth year], 48 y.o.   MRN: [id number]  Steve Welch is a 48 y.o. male presenting on 03/24/2018 for Hypertension and Knee Pain (chronic bilateral knee pain. )   HPI Hypertension - He is not checking BP at home or outside of clinic.    - Current medications: atenolol-chlorthalidone 100-25 mg once daily, tolerating well without side effects - He is not currently symptomatic. - Pt denies headache, lightheadedness, dizziness, changes in vision, chest tightness/pressure, palpitations, leg swelling, sudden loss of speech or loss of consciousness. - He  reports no regular exercise routine. - His diet is moderate in salt, moderate in fat, and moderate in carbohydrates.  - Labs not collected at last visit due to cost concerns. Patient unaware of [facility] financial assistance application.   Chronic bilateral knee pain Patient reports continued chronic, severe pain in bilateral knees.  Patient has long-standing history of morbid obesity, but this is improved over last several years.  Patient weighed 514 lbs at heaviest weight. Patient continues to lose weight and is now at 340 lbs (335-340 usually and feels stuck). - Tylenol and ibuprofen continues without significant relief. - For regular pain management, patient has gotten a scooter for mobility. - Continues to wear bilateral knee braces daily for increasing stability. Patient has done this for several years at this point in time.  Depression/situational anxiety Continues drawing/painting for mood stability.  Received large canvas at Christmas he is currently painting.   Continues buspirone 5 mg bid with moderate improvement.  Depression continues to revolve around feeling like he cannot contribute meaningfully financially to his household and cannot be the father that he wishes he could due to limitations in activities.  Erectile Dysfunction Patient reports difficulty with obtaining full  erection during sexual intercourse. He requests information about what is required to start sildenafil. He has normal libido, but is unable to obtain firm erection.  He continues to have morning erection some days.  Admits increased stress, other partner medical issues as other reasons for decreased efforts recently to participate in sexual activity.  Patient continues in a mutually monogamous relationship.   [facility] 03/24/2018 06/19/2016  Decreased Interest 1 1  Down, Depressed, Hopeless 2 1  PHQ - 2 Score 3 2  Altered sleeping 3 2  Tired, decreased energy 1 0  Change in appetite 0 1  Feeling bad or failure about yourself  3 3  Trouble concentrating 0 0  Moving slowly or fidgety/restless 0 1  Suicidal thoughts 0 0  PHQ-9 Score 10 9  Difficult doing work/chores Not difficult at all -     Social History   Tobacco Use  . Smoking status: Never Smoker  . Smokeless tobacco: Current User    Types: Snuff  . Tobacco comment: started 18 x 2 years, 2010 1 can every 3 days  Substance Use Topics  . Alcohol use: No  . Drug use: No    Review of Systems Per HPI unless specifically indicated above     Objective:    BP 123/84 (BP Location: Left Arm, Patient Position: Sitting, Cuff Size: Large)   Pulse 89   Temp 97.8 F (36.6 C) (Oral)   Ht 6\' 6"  (1.981 m)   Wt (!) 340 lb 8 oz (154.4 kg)   BMI 39.35 kg/m   Wt Readings from Last 3 Encounters:  03/24/18 (!) 340 lb 8 oz (154.4 kg)  02/25/17 (!) 343 lb (  155.6 kg)  06/19/16 (!) 339 lb 9.6 oz (154 kg)    Physical Exam Vitals signs reviewed.  Constitutional:      General: He is awake. He is not in acute distress.    Appearance: He is well-developed.  HENT:     Head: Normocephalic and atraumatic.  Neck:     Musculoskeletal: Normal range of motion and neck supple.     Vascular: No carotid bruit.  Cardiovascular:     Rate and Rhythm: Normal rate and regular rhythm.     Pulses:          Radial pulses are 2+ on the right  side and 2+ on the left side.       Posterior tibial pulses are 1+ on the right side and 1+ on the left side.     Heart sounds: Normal heart sounds, S1 normal and S2 normal.  Pulmonary:     Effort: Pulmonary effort is normal. No respiratory distress.     Breath sounds: Normal breath sounds and air entry.  Musculoskeletal:     Comments: Bilateral Knees Inspection: Normal appearance and symmetrical. No ecchymosis or effusion.  Patient is wearing bilateral knee braces and wears daily/all day. Palpation: moderately TTP bilateral knees of joint space and surrounding soft tissue/muscles.  Crepitus bilaterally through full ROM.   ROM: Full active ROM bilaterally Special Testing: Lachman / Valgus/Varus tests negative with intact ligaments (ACL, MCL, LCL). McMurray positive with meniscus symptoms. Strength: 3/5 intact knee flex/ext, ankle dorsi/plantarflex Neurovascular: distally intact sensation light touch and pulses   Skin:    General: Skin is warm and dry.  Neurological:     Mental Status: He is alert and oriented to person, place, and time.  Psychiatric:        Attention and Perception: Attention normal.        Mood and Affect: Mood and affect normal.        Behavior: Behavior normal. Behavior is cooperative.       Assessment & Plan:   Problem List Items Addressed This Visit      Cardiovascular and Mediastinum   Hypertension - Primary Controlled hypertension.  BP goal < 130/80.  Pt is working on lifestyle modifications to obtain more weight loss. No regular physical activity due to knee limitations  Taking medications tolerating well without side effects. Complication: patient has not obtained labs as requested at last visit.  Plan: 1. Continue taking atenolol-chlorthalidone 100-25 mg once daily.  BP at goal. 2. Obtain labs today before leaving clinic.  MUST have BMP.  Offered patient options for accessible clinics that cover labs for uninsured patients, refused transition.  Reinforced  to patient the need for medical safety of drugs to have labs today.  Verbalized understanding. 3. Encouraged heart healthy diet and increasing exercise to 30 minutes most days of the week. 4. Check BP 1-2 x per week at home, keep log, and bring to clinic at next appointment. 5. Follow up 6 months.     Relevant Medications   atenolol-chlorthalidone (TENORETIC) 100-25 MG tablet   Other Relevant Orders   BASIC METABOLIC PANEL WITH GFR     Musculoskeletal and Integument   Osteoarthritis of both knees Chronic, stable, severe arthritis by exam.  No imaging performed in past.  Unable to access imaging due to cost at this time.  Patient also declines orthopedic referral at this time.  Encouraged weight loss to improve symptoms.  Start knee strengthening exercises.  Advised to have exercises performed without  knee brace for muscle strengthening/joint support. Follow-up 6 months prn.     Relevant Medications   meloxicam (MOBIC) 15 MG tablet   Other Relevant Orders   BASIC METABOLIC PANEL WITH GFR     Other   Anxiety Stable, slightly improved until last week with additional situational stressor.  Continues on buspirone 5 mg bid.  Follow-up 6 months.   Relevant Medications   busPIRone (BUSPAR) 5 MG tablet   Erectile dysfunction Chronic problem for patient.  Likely due to multiple factors that cannot be fully evaluated medically at this time due to cost.  Medication: atenolol.  Unlikely due to low testosterone based on symptoms reported.  Plan: 1. Offered option to start sildenafil.  Reviewed side effects of possible priapism and need to avoid all nitrates if chest pain occurs within 24-48 hrs of taking medication. 2. Patient states he will call clinic if he decides to start sildenafil.   Side effects and cost are concern currently. 3. Follow-up prn.      Meds ordered this encounter  Medications  . meloxicam (MOBIC) 15 MG tablet    Sig: Take 1 tablet (15 mg total) by mouth daily.    Dispense:   30 tablet    Refill:  0    Order Specific Question:   Supervising Provider    Answer:   Smitty Cords [[id number]]  . busPIRone (BUSPAR) 5 MG tablet    Sig: Take 1 tablet (5 mg total) by mouth 2 (two) times daily.    Dispense:  60 tablet    Refill:  5    Order Specific Question:   Supervising Provider    Answer:   Smitty Cords [[id number]]  . atenolol-chlorthalidone (TENORETIC) 100-25 MG tablet    Sig: Take 1 tablet by mouth daily.    Dispense:  30 tablet    Refill:  5    Order Specific Question:   Supervising Provider    Answer:   Smitty Cords [[id number]]    Follow up plan: Return in about 6 months (around 09/22/2018) for hypertension.  Wilhelmina Mcardle, DNP, AGPCNP-BC Adult Gerontology Primary Care Nurse Practitioner [facility] 03/24/2018, 9:01 AM

## 2018-03-24 NOTE — Patient Instructions (Addendum)
Steve Welch,   Thank you for coming in to clinic today.  1. You have chronic knee degeneration both joints. - Start taking Tylenol extra strength 1 to 2 tablets every 6-8 hours for aches or fever/chills for next few days as needed.  Do not take more than 3,000 mg in 24 hours from all medicines.   - START meloxicam 15 mg once daily.  Skip 1-2 days per month for kidney protection. While taking meloxicam NO additional ibuprofen or naproxen. - If not taking meloxicam, May take Ibuprofen as well if tolerated 400-600mg  every 8 hours as needed. (or 800 mg tiwce daily) May alternate tylenol and ibuprofen in same day. - Use heat and ice.  Apply this for 15 minutes at a time 6-8 times per day.   - Muscle rub with lidocaine, lidocaine patch, Biofreeze, or tiger balm for topical pain relief.  Avoid using this with heat and ice to avoid burns.  2. Continue all other medications without changes today for blood pressure and depression/anxiety.  Please schedule a follow-up appointment with Wilhelmina Mcardle, AGNP. Return in about 6 months (around 09/22/2018) for hypertension.  If you have any other questions or concerns, please feel free to call the clinic or send a message through MyChart. You may also schedule an earlier appointment if necessary.  You will receive a survey after today's visit either digitally by e-mail or paper by [employer]. Your experiences and feedback matter to [place].  Please respond so we know how we are doing as we provide care for you.   Wilhelmina Mcardle, DNP, AGNP-BC Adult Gerontology Nurse Practitioner [facility], [facility]    Knee Exercises              Ask your health care provider which exercises are safe for you. Do exercises exactly as told by your health care provider and adjust them as directed. It is normal to feel mild stretching, pulling, tightness, or discomfort as you do these exercises, but you should stop right away if you feel sudden pain or your pain  gets worse.Do not begin these exercises until told by your health care provider. STRETCHING AND RANGE OF MOTION EXERCISES These exercises warm up your muscles and joints and improve the movement and flexibility of your knee. These exercises also help to relieve pain, numbness, and tingling. Exercise A: Knee Extension, Prone 1. Lie on your abdomen on a bed. 2. Place your left / right knee just beyond the edge of the surface so your knee is not on the bed. You can put a towel under your left / right thigh just above your knee for comfort. 3. Relax your leg muscles and allow gravity to straighten your knee. You should feel a stretch behind your left / right knee. 4. Hold this position for __________ seconds. 5. Scoot up so your knee is supported between repetitions. Repeat __________ times. Complete this stretch __________ times a day. Exercise B: Knee Flexion, Active 1. Lie on your back with both knees straight. If this causes back discomfort, bend your left / right knee so your foot is flat on the floor. 2. Slowly slide your left / right heel back toward your buttocks until you feel a gentle stretch in the front of your knee or thigh. 3. Hold this position for __________ seconds. 4. Slowly slide your left / right heel back to the starting position. Repeat __________ times. Complete this exercise __________ times a day. Exercise C: Quadriceps, Prone 1. Lie on  your abdomen on a firm surface, such as a bed or padded floor. 2. Bend your left / right knee and hold your ankle. If you cannot reach your ankle or pant leg, loop a belt around your foot and grab the belt instead. 3. Gently pull your heel toward your buttocks. Your knee should not slide out to the side. You should feel a stretch in the front of your thigh and knee. 4. Hold this position for __________ seconds. Repeat __________ times. Complete this stretch __________ times a day. Exercise D: Hamstring, Supine 1. Lie on your back. 2. Loop  a belt or towel over the ball of your left / right foot. The ball of your foot is on the walking surface, right under your toes. 3. Straighten your left / right knee and slowly pull on the belt to raise your leg until you feel a gentle stretch behind your knee. ? Do not let your left / right knee bend while you do this. ? Keep your other leg flat on the floor. 4. Hold this position for __________ seconds. Repeat __________ times. Complete this stretch __________ times a day. STRENGTHENING EXERCISES These exercises build strength and endurance in your knee. Endurance is the ability to use your muscles for a long time, even after they get tired. Exercise E: Quadriceps, Isometric 1. Lie on your back with your left / right leg extended and your other knee bent. Put a rolled towel or small pillow under your knee if told by your health care provider. 2. Slowly tense the muscles in the front of your left / right thigh. You should see your kneecap slide up toward your hip or see increased dimpling just above the knee. This motion will push the back of the knee toward the floor. 3. For __________ seconds, keep the muscle as tight as you can without increasing your pain. 4. Relax the muscles slowly and completely. Repeat __________ times. Complete this exercise __________ times a day. Exercise F: Straight Leg Raises - Quadriceps 1. Lie on your back with your left / right leg extended and your other knee bent. 2. Tense the muscles in the front of your left / right thigh. You should see your kneecap slide up or see increased dimpling just above the knee. Your thigh may even shake a bit. 3. Keep these muscles tight as you raise your leg 4-6 inches (10-15 cm) off the floor. Do not let your knee bend. 4. Hold this position for __________ seconds. 5. Keep these muscles tense as you lower your leg. 6. Relax your muscles slowly and completely after each repetition. Repeat __________ times. Complete this exercise  __________ times a day. Exercise G: Hamstring, Isometric 1. Lie on your back on a firm surface. 2. Bend your left / right knee approximately __________ degrees. 3. Dig your left / right heel into the surface as if you are trying to pull it toward your buttocks. Tighten the muscles in the back of your thighs to dig as hard as you can without increasing any pain. 4. Hold this position for __________ seconds. 5. Release the tension gradually and allow your muscles to relax completely for __________ seconds after each repetition. Repeat __________ times. Complete this exercise __________ times a day. Exercise H: Hamstring Curls If told by your health care provider, do this exercise while wearing ankle weights. Begin with __________ weights. Then increase the weight by 1 lb (0.5 kg) increments. Do not wear ankle weights that are more than __________.  1. Lie on your abdomen with your legs straight. 2. Bend your left / right knee as far as you can without feeling pain. Keep your hips flat against the floor. 3. Hold this position for __________ seconds. 4. Slowly lower your leg to the starting position. Repeat __________ times. Complete this exercise __________ times a day. Exercise I: Squats (Quadriceps) 1. Stand in front of a table, with your feet and knees pointing straight ahead. You may rest your hands on the table for balance but not for support. 2. Slowly bend your knees and lower your hips like you are going to sit in a chair. ? Keep your weight over your heels, not over your toes. ? Keep your lower legs upright so they are parallel with the table legs. ? Do not let your hips go lower than your knees. ? Do not bend lower than told by your health care provider. ? If your knee pain increases, do not bend as low. 3. Hold the squat position for __________ seconds. 4. Slowly push with your legs to return to standing. Do not use your hands to pull yourself to standing. Repeat __________ times.  Complete this exercise __________ times a day. Exercise K: Straight Leg Raises - Hip Abductors 1. Lie on your side with your left / right leg in the top position. Lie so your head, shoulder, knee, and hip line up. You may bend your bottom knee to help you keep your balance. 2. Roll your hips slightly forward so your hips are stacked directly over each other and your left / right knee is facing forward. 3. Leading with your heel, lift your top leg 4-6 inches (10-15 cm). You should feel the muscles in your outer hip lifting. ? Do not let your foot drift forward. ? Do not let your knee roll toward the ceiling. 4. Hold this position for __________ seconds. 5. Slowly return your leg to the starting position. 6. Let your muscles relax completely after each repetition. Repeat __________ times. Complete this exercise __________ times a day. Exercise L: Straight Leg Raises - Hip Extensors 1. Lie on your abdomen on a firm surface. You can put a pillow under your hips if that is more comfortable. 2. Tense the muscles in your buttocks and lift your left / right leg about 4-6 inches (10-15 cm). Keep your knee straight as you lift your leg. 3. Hold this position for __________ seconds. 4. Slowly lower your leg to the starting position. 5. Let your leg relax completely after each repetition. Repeat __________ times. Complete this exercise __________ times a day. This information is not intended to replace advice given to you by your health care provider. Make sure you discuss any questions you have with your health care provider. Document Released: 12/17/2004 Document Revised: 10/28/2015 Document Reviewed: 12/09/2014 Elsevier Interactive Patient Education  2019 [employer] Inc[employer]  Knee Rehabilitation in the Home After knee surgery, it is important to follow instructions from your health care provider about rehabilitation (rehab). It is important to design a program that is safe and effective for you. Your health  care provider and rehabilitation therapist will work with you to meet your specific abilities and needs. What are the benefits? Knee rehab can help to:  Strengthen your knee.  Improve the flexibility and movement (range of motion) of your knee joint.  Reduce swelling.  Improve blood flow and prevent blood clots. How to do exercises at home   Continue exercises at home that your health care provider  or physical therapist instructed you to do in the hospital.  Before you exercise: ? Take pain medicines, if told by your health care provider. Do not take the medicine if it makes you feel dizzy or sleepy. ? Do a warm-up activity, such as gentle walking or riding a stationary bike, as told by your health care provider. Doing that warms up your muscles and helps to prevent injury.  While doing exercises: ? When standing, make sure you are near something sturdy that you can hold onto for balance, such as a heavy chair or the wall. ? Do exercises exactly as told by your health care provider and adjust them as directed. ? As you are recovering, choose an exercise pace that is comfortable for you, and gradually work up to your goal. ? Do not force your knee to bend.  Do not exercise in a pool (aquatic therapy) until your incision is healed and your health care provider says that you can. Follow these instructions at home: Activity  Do not use your knee to support (bear) your body weight until your health care provider says that you can. Follow weight-bearing restrictions as told. Use crutches or a walker as told by your health care provider.  Do not twist or kneel on your injured knee.  Ask your health care provider what activities are safe for you during recovery, and ask what activities you need to avoid.  Avoid sitting for a long time without moving. Get up to take short walks every 1-2 hours. Ask for help if you feel weak or unsteady. Managing pain, stiffness, and swelling   Put ice  on affected areas after you exercise, or as needed. Icing can help to relieve joint pain and swelling. ? Put ice in a plastic bag or use the icing device (cold flow pad or cold therapy unit) that you were given. Follow instructions from your health care provider about how to use the icing device. ? Place a towel between your skin and the bag or device. ? Leave the ice on for 20 minutes, 2-3 times a day.  If directed, apply heat to affected areas before you exercise, or as needed. Heat can reduce the stiffness of your muscles and joints. Use the heat source that your health care provider recommends, such as a moist heat pack or a heating pad. ? Place a towel between your skin and the heat source. ? Leave the heat on for 20-30 minutes. ? Remove the heat if your skin turns bright red. This is especially important if you are unable to feel pain, heat, or cold. You may have a greater risk of getting burned.  Wear compression stockings as told by your health care provider. These stockings help to prevent blood clots and reduce swelling in your legs.  Raise (elevate) your legs while sitting or lying down. Do not place your knee on top of pillows to elevate it. Keep your legs straight to prevent your knee from getting stuck in a bent position (contracture). Preventing falls   Keep your home well-lit and clutter-free, especially in walkways and stairways. Keep floors dry and use non-skid mats.  Remove tripping hazards from floors, such as throw rugs and cords.  Install grab bars in bathrooms, and put night-lights in your bedroom and bathroom.  Wear closed-toe shoes that fit well and support your feet. Wear shoes that have rubber soles or low heels.  Talk with your health care provider about the over-the-counter and prescription medicines that you  are taking. Some medicines can cause dizziness or changes in blood pressure, which increase your risk of falling. General recommendations  Teach your  family about your condition and how they can participate in your recovery. Include them during a physical therapy session.  Keep all follow-up visits as told by your health care provider and physical therapist. This is important. Questions to ask your health care provider  What exercises are safe for me to do?  How often should I do the exercises?  How can I manage the pain during exercise?  What other activities are safe for me to do? Contact a health care provider if:  You have questions about how to do exercises correctly.  You have increased difficulty bending your knee.  You have knee pain that does not go away after you rest and take pain medicines.  Your prosthesis feels loose.  You are not able to do exercises. Get help right away if:  You fall.  Your incision from surgery breaks open. Summary  Knee rehab can help to strengthen your knee and improve the flexibility and movement (range of motion) of your knee joint.  Continue exercises at home that your health care provider or physical therapist instructed you to do in the hospital.  Call your health care provider if you have questions about how to do exercises correctly. This information is not intended to replace advice given to you by your health care provider. Make sure you discuss any questions you have with your health care provider. Document Released: 02/02/2005 Document Revised: 12/28/2016 Document Reviewed: 12/28/2016 Elsevier Interactive Patient Education  2019 [employer]

## 2018-03-25 ENCOUNTER — Other Ambulatory Visit: Payer: Self-pay

## 2018-03-30 ENCOUNTER — Encounter: Payer: Self-pay | Admitting: Nurse Practitioner

## 2018-08-31 ENCOUNTER — Telehealth: Payer: Self-pay

## 2018-08-31 NOTE — Telephone Encounter (Signed)
The pt wife called requesting a letter stating her husband chronic illness that hinder him from working. She needs this letter for her [employer] officer, [employer].

## 2018-08-31 NOTE — Telephone Encounter (Signed)
I am not familiar with this patient, I would ask that they can reach out to Cassell Smiles, AGPCNP-BC when she returns and perhaps do a virtual telephone visit to discuss the details of what needs to be documented or written and what diagnoses are the chronic illness they are referring to  Steve Welch, [facility] 08/31/2018, 1:15 PM

## 2018-08-31 NOTE — Telephone Encounter (Signed)
The pt was notified and appt scheduled for 7/24 at 10:00am.

## 2018-09-09 ENCOUNTER — Ambulatory Visit (INDEPENDENT_AMBULATORY_CARE_PROVIDER_SITE_OTHER): Payer: Self-pay | Admitting: Nurse Practitioner

## 2018-09-09 ENCOUNTER — Encounter: Payer: Self-pay | Admitting: Nurse Practitioner

## 2018-09-09 ENCOUNTER — Other Ambulatory Visit: Payer: Self-pay

## 2018-09-09 DIAGNOSIS — I10 Essential (primary) hypertension: Secondary | ICD-10-CM

## 2018-09-09 DIAGNOSIS — M172 Bilateral post-traumatic osteoarthritis of knee: Secondary | ICD-10-CM

## 2018-09-09 DIAGNOSIS — F419 Anxiety disorder, unspecified: Secondary | ICD-10-CM

## 2018-09-09 MED ORDER — MELOXICAM 15 MG PO TABS
15.0000 mg | ORAL_TABLET | Freq: Every day | ORAL | 0 refills | Status: DC
Start: 2018-09-09 — End: 2019-07-18

## 2018-09-09 MED ORDER — ATENOLOL-CHLORTHALIDONE 100-25 MG PO TABS: 1.0000 | ORAL_TABLET | Freq: Every day | ORAL | 0 refills | Status: DC

## 2018-09-09 MED ORDER — BUSPIRONE HCL 10 MG PO TABS: 10.0000 mg | ORAL_TABLET | Freq: Two times a day (BID) | ORAL | 2 refills | Status: DC

## 2018-09-09 NOTE — Progress Notes (Signed)
Telemedicine Encounter: Disclosed to patient at start of encounter that we will provide appropriate telemedicine services.  Patient consents to be treated via phone prior to discussion. - Patient is at his home and is accessed via telephone. - Services are provided by Cassell Smiles from [facility].   Subjective:    Patient ID: Steve Welch, male    DOB: 1970/09/30, 48 y.o.   MRN: [id number]  Steve Welch is a 48 y.o. male presenting on 09/09/2018 for Arthritis (Patient states he needs a letter stating that he needs a letter from his doctor for him to recieve his food stamps because he can't work. ), Joint Aches/Pain (Patient states pain is increasing), and Medication Refill (Needs refill on BP medication)  HPI Arthritis Disability certification has "run out."  Applied in 2010.  Kept denying;appealing.  Finally had approval for court date with expiration of disability work credits.  Is now applying for SSI.  Requesting a letter from medical doctor.  Paperwork from [facility] is requesting letter.   Uses a scooter for mobility.  Patient also has back and hip pain that limit full sitting job. - Patient continues meloxicam regularly.  Hypertension Last reading today 130/80, averaging 130/80 most times.  Occasionally higher 145/90.  Pt denies changes in vision, chest tightness/pressure, palpitations, shortness of breath, leg pain while walking, leg or arm weakness, and sudden loss of speech or loss of consciousness.  Anxiety Patient reports a recent death in extended family and change in her stress at home.  He is currently taking buspirone 5 mg twice daily which was previously working well.  He requests to consider short-term or long term increase in this medication.  Most bothersome the patient has his inability to control his worrying.  GAD 7 : Generalized Anxiety Score 09/09/2018  Nervous, Anxious, on Edge 2  Control/stop worrying 2  Worry too  much - different things 2  Trouble relaxing 2  Restless 2  Easily annoyed or irritable 1  Afraid - awful might happen 1  Total GAD 7 Score 12  Anxiety Difficulty Not difficult at all     Depression screen [facility] 2/9 09/09/2018 03/24/2018 06/19/2016  Decreased Interest 1 1 1   Down, Depressed, Hopeless 2 2 1   PHQ - 2 Score 3 3 2   Altered sleeping 2 3 2   Tired, decreased energy 2 1 0  Change in appetite 0 0 1  Feeling bad or failure about yourself  2 3 3   Trouble concentrating 0 0 0  Moving slowly or fidgety/restless 0 0 1  Suicidal thoughts 0 0 0  PHQ-9 Score 9 10 9   Difficult doing work/chores Somewhat difficult Not difficult at all -    Social History   Tobacco Use  . Smoking status: Never Smoker  . Smokeless tobacco: Current User    Types: Snuff  . Tobacco comment: started 18 x 2 years, 2010 1 can every 3 days  Substance Use Topics  . Alcohol use: No  . Drug use: No    Review of Systems Per HPI unless specifically indicated above     Objective:    BP 130/80 (BP Location: Left Arm, Patient Position: Sitting, Cuff Size: Normal)   Pulse 82   SpO2 98%   Wt Readings from Last 3 Encounters:  03/24/18 (!) 340 lb 8 oz (154.4 kg)  02/25/17 (!) 343 lb (155.6 kg)  06/19/16 (!) 339 lb 9.6 oz (154 kg)    Physical Exam Patient remotely monitored.  Verbal communication appropriate.  Cognition normal.   No results found for this or any previous visit.    Assessment & Plan:   Problem List Items Addressed This Visit      Cardiovascular and Mediastinum   Hypertension Home blood pressure readings at goal on atenolol-chlorthalidone 100-25 mg once daily.  Patient was requested to get labs at last several visits and he has not received these.  Boundary setting today that if he does not have kidney function checked, he will no longer be able to be prescribed medication from this clinic due to unsafe medical practice.  Patient verbalized understanding and stated he would have labs done.   Follow-up with labs next 1 to 2 weeks.  Refill medication for 30 days only.  Follow-up 3 months.   Relevant Medications   atenolol-chlorthalidone (TENORETIC) 100-25 MG tablet   Other Relevant Orders   BASIC METABOLIC PANEL WITH GFR     Musculoskeletal and Integument   Osteoarthritis of both knees Severe osteoarthritis bilateral knees prevents patient from ambulating well.  Limited to less than 100 yards.  Prolonged sitting also becomes uncomfortable within 1 to 2 hours.  Knee replacement surgery has been recommended by orthopedics, but patient is unable to afford this procedure at this time.  His limitations continue to prevent him from being able to work most jobs.  Plan: 1.  Continue meloxicam 15 mg once daily. 2.  Again, patient must get kidney function testing performed prior to any additional refills. 3.  Follow-up 3 to 6 months.   Relevant Medications   meloxicam (MOBIC) 15 MG tablet     Other   Anxiety Acutely worsened anxiety due to situational stressor.  Previously well controlled on buspirone 5 mg twice daily.  Plan: 1.  Reinforced nonpharmacological treatments. 2.  Increase buspirone to 10 mg twice daily. 3.  Follow-up 6 weeks PRN if no improvement.   Relevant Medications   busPIRone (BUSPAR) 10 MG tablet      Meds ordered this encounter  Medications  . busPIRone (BUSPAR) 10 MG tablet    Sig: Take 1 tablet (10 mg total) by mouth 2 (two) times daily.    Dispense:  60 tablet    Refill:  2    Order Specific Question:   Supervising Provider    Answer:   Smitty CordsKARAMALEGOS, ALEXANDER J [[id number]]  . atenolol-chlorthalidone (TENORETIC) 100-25 MG tablet    Sig: Take 1 tablet by mouth daily.    Dispense:  30 tablet    Refill:  0    Order Specific Question:   Supervising Provider    Answer:   Smitty CordsKARAMALEGOS, ALEXANDER J [[id number]]  . meloxicam (MOBIC) 15 MG tablet    Sig: Take 1 tablet (15 mg total) by mouth daily.    Dispense:  30 tablet    Refill:  0    Order Specific Question:    Supervising Provider    Answer:   Smitty CordsKARAMALEGOS, ALEXANDER J [[id number]]    - Time spent in direct consultation with patient via telemedicine about above concerns: 14 minutes  Follow up plan: Return in about 3 months (around 12/10/2018) for hypertension, anxiety.  Wilhelmina McardleLauren Faelynn Wynder, DNP, AGPCNP-BC Adult Gerontology Primary Care Nurse Practitioner [facility] 09/09/2018, 10:54 AM

## 2018-09-14 ENCOUNTER — Encounter: Payer: Self-pay | Admitting: Nurse Practitioner

## 2018-09-22 ENCOUNTER — Ambulatory Visit: Payer: Self-pay | Admitting: Nurse Practitioner

## 2018-11-04 ENCOUNTER — Other Ambulatory Visit: Payer: Self-pay

## 2018-11-04 ENCOUNTER — Telehealth: Payer: Self-pay | Admitting: Nurse Practitioner

## 2018-11-04 DIAGNOSIS — I10 Essential (primary) hypertension: Secondary | ICD-10-CM

## 2018-11-04 MED ORDER — ATENOLOL-CHLORTHALIDONE 100-25 MG PO TABS: 1.0000 | ORAL_TABLET | Freq: Every day | ORAL | 1 refills | Status: DC

## 2018-11-04 NOTE — Telephone Encounter (Signed)
Pt. Is   requesting refill on his  BP  Medication called into [facility] [address]

## 2018-11-05 LAB — BASIC METABOLIC PANEL WITH GFR
BUN: 13 mg/dL (ref 7–25)
CO2: 27 mmol/L (ref 20–32)
Calcium: 9.2 mg/dL (ref 8.6–10.3)
Chloride: 93 mmol/L — ABNORMAL LOW (ref 98–110)
Creat: 0.68 mg/dL (ref 0.60–1.35)
GFR, Est African American: 130 mL/min/{1.73_m2} (ref >=60)
GFR, Est Non African American: 112 mL/min/{1.73_m2} (ref >=60)
Glucose, Bld: 348 mg/dL — ABNORMAL HIGH (ref 65–99)
Potassium: 3.7 mmol/L (ref 3.5–5.3)
Sodium: 134 mmol/L — ABNORMAL LOW (ref 135–146)

## 2018-11-06 ENCOUNTER — Encounter: Payer: Self-pay | Admitting: Nurse Practitioner

## 2018-11-06 DIAGNOSIS — E1165 Type 2 diabetes mellitus with hyperglycemia: Secondary | ICD-10-CM | POA: Insufficient documentation

## 2018-11-06 DIAGNOSIS — E1169 Type 2 diabetes mellitus with other specified complication: Secondary | ICD-10-CM | POA: Insufficient documentation

## 2018-12-12 ENCOUNTER — Telehealth: Payer: Self-pay

## 2018-12-12 DIAGNOSIS — R7309 Other abnormal glucose: Secondary | ICD-10-CM

## 2018-12-12 NOTE — Telephone Encounter (Signed)
The pt called to scheduled an appt to get his lab drawn due to a recent elevated glucose level on his previous lab per Lauren request.

## 2018-12-15 ENCOUNTER — Ambulatory Visit (INDEPENDENT_AMBULATORY_CARE_PROVIDER_SITE_OTHER): Payer: Self-pay | Admitting: Family Medicine

## 2018-12-15 ENCOUNTER — Other Ambulatory Visit: Payer: Self-pay

## 2018-12-15 ENCOUNTER — Encounter: Payer: Self-pay | Admitting: Family Medicine

## 2018-12-15 VITALS — BP 125/80 | HR 72 | Temp 98.6°F

## 2018-12-15 DIAGNOSIS — R739 Hyperglycemia, unspecified: Secondary | ICD-10-CM

## 2018-12-15 DIAGNOSIS — I10 Essential (primary) hypertension: Secondary | ICD-10-CM

## 2018-12-15 LAB — BASIC METABOLIC PANEL WITH GFR
BUN: 11 mg/dL (ref 7–25)
CO2: 31 mmol/L (ref 20–32)
Calcium: 9.3 mg/dL (ref 8.6–10.3)
Chloride: 93 mmol/L — ABNORMAL LOW (ref 98–110)
Creat: 0.64 mg/dL (ref 0.60–1.35)
GFR, Est African American: 133 mL/min/{1.73_m2} (ref >=60)
GFR, Est Non African American: 115 mL/min/{1.73_m2} (ref >=60)
Glucose, Bld: 310 mg/dL — ABNORMAL HIGH (ref 65–99)
Potassium: 3.7 mmol/L (ref 3.5–5.3)
Sodium: 136 mmol/L (ref 135–146)

## 2018-12-15 MED ORDER — ATENOLOL-CHLORTHALIDONE 100-25 MG PO TABS: 1.0000 | ORAL_TABLET | Freq: Every day | ORAL | 5 refills | Status: DC

## 2018-12-15 NOTE — Progress Notes (Signed)
Virtual Visit via Telephone The purpose of this virtual visit is to provide medical care while limiting exposure to the novel coronavirus (COVID19) for both patient and office staff.  Consent was obtained for phone visit:  Yes.   Answered questions that patient had about telehealth interaction:  Yes.   I discussed the limitations, risks, security and privacy concerns of performing an evaluation and management service by telephone. I also discussed with the patient that there may be a patient responsible charge related to this service. The patient expressed understanding and agreed to proceed.  Patient Location: Home [facility]  ---------------------------------------------------------------------- Chief Complaint  Patient presents with  . Medication Refill    blood pressure  . Hypertension    Previous PCP Wilhelmina McardleLauren Kennedy, AGPCNP-BC   S: Reviewed CMA documentation. I have called patient and gathered additional HPI as follows:  CHRONIC HTN: Reports seems controlled on current med, refilled recently. Last seen 08/2018 Current Meds -   Atenolol-Chlorthalidone 100-25mg  Reports good compliance, took meds today. Tolerating well, w/o complaints. Denies CP, dyspnea, HA, edema, dizziness / lightheadedness  Hyperglycemia Recent labs from PCP starting 10/2018 showed hyperglycemia Glucose 348, then repeat in 12/14/18 showed Glucose 310, no prior dx Diabetes in past. Patient was anticipated to have A1c lab drawn as well in October but it was not released by lab. Wt loss 514 lbs down to 300s He has come glucometer for monitoring now Not eating sweets Tries to exercise, has arthritis  Denies any high risk travel to areas of current concern for COVID19. Denies any known or suspected exposure to person with or possibly with COVID19.  Denies any fevers, chills, sweats, body ache, cough, shortness of breath, sinus pain or pressure, headache, abdominal pain,  diarrhea  Past Medical History:  Diagnosis Date  . Allergy   . Anxiety   . Arthritis    knees and shoulders  . Depression   . GERD (gastroesophageal reflux disease)   . Hiatal hernia   . Hyperlipidemia   . Hypertension   . Peptic ulcer    gastric and duodenal   Social History   Tobacco Use  . Smoking status: Never Smoker  . Smokeless tobacco: Current User    Types: Snuff  . Tobacco comment: started 18 x 2 years, 2010 1 can every 3 days  Substance Use Topics  . Alcohol use: No  . Drug use: No    Current Outpatient Medications:  .  Ascorbic Acid (VITAMIN C) 100 MG tablet, Take 100 mg by mouth daily., Disp: , Rfl:  .  atenolol-chlorthalidone (TENORETIC) 100-25 MG tablet, Take 1 tablet by mouth daily., Disp: 30 tablet, Rfl: 5 .  busPIRone (BUSPAR) 10 MG tablet, Take 1 tablet (10 mg total) by mouth 2 (two) times daily., Disp: 60 tablet, Rfl: 2 .  cetirizine (ZYRTEC) 10 MG tablet, Take 10 mg by mouth daily., Disp: , Rfl:  .  Glucosamine Sulfate 1000 MG CAPS, Take by mouth., Disp: , Rfl:  .  meloxicam (MOBIC) 15 MG tablet, Take 1 tablet (15 mg total) by mouth daily., Disp: 30 tablet, Rfl: 0 .  Multiple Vitamin (MULTIVITAMIN) tablet, Take 1 tablet by mouth daily., Disp: , Rfl:  .  omeprazole (PRILOSEC) 20 MG capsule, Take 20 mg by mouth 2 (two) times daily before a meal. , Disp: , Rfl:  .  vitamin B-12 (CYANOCOBALAMIN) 100 MCG tablet, Take 100 mcg by mouth daily., Disp: , Rfl:  .  vitamin E 100 UNIT capsule,  Take 100 Units by mouth daily., Disp: , Rfl:  .  OVER THE COUNTER MEDICATION, Take 1 capsule by mouth daily. [place], Disp: , [place]:   Depression screen [facility] 2/9 12/15/2018 09/09/2018 03/24/2018  Decreased Interest 0 1 1  Down, Depressed, Hopeless 1 2 2   PHQ - 2 Score 1 3 3   Altered sleeping 2 2 3   Tired, decreased energy 0 2 1  Change in appetite 0 0 0  Feeling bad or failure about yourself  2 2 3   Trouble concentrating 0 0 0  Moving slowly or fidgety/restless 0 0 0   Suicidal thoughts 0 0 0  PHQ-9 Score 5 9 10   Difficult doing work/chores Not difficult at all Somewhat difficult Not difficult at all    GAD 7 : Generalized Anxiety Score 12/15/2018 09/09/2018  Nervous, Anxious, on Edge 0 2  Control/stop worrying 1 2  Worry too much - different things 2 2  Trouble relaxing 0 2  Restless 0 2  Easily annoyed or irritable 1 1  Afraid - awful might happen 1 1  Total GAD 7 Score 5 12  Anxiety Difficulty Somewhat difficult Not difficult at all    -------------------------------------------------------------------------- O: No physical exam performed due to remote telephone encounter.  Lab results reviewed.  Recent Results (from the past 2160 hour(s))  BASIC METABOLIC PANEL WITH GFR     Status: Abnormal   Collection Time: 11/04/18  8:50 AM  Result Value Ref Range   Glucose, Bld 348 (H) 65 - 99 mg/dL    Comment: .            Fasting reference interval . For someone without known diabetes, a glucose value >125 mg/dL indicates that they may have diabetes and this should be confirmed with a follow-up test. .    BUN 13 7 - 25 mg/dL   Creat 0.68 0.60 - 1.35 mg/dL   GFR, Est Non African American 112 > OR = 60 mL/min/1.92m2   GFR, Est African American 130 > OR = 60 mL/min/1.101m2   BUN/Creatinine Ratio NOT APPLICABLE 6 - 22 (calc)   Sodium 134 (L) 135 - 146 mmol/L   Potassium 3.7 3.5 - 5.3 mmol/L   Chloride 93 (L) 98 - 110 mmol/L   CO2 27 20 - 32 mmol/L   Calcium 9.2 8.6 - 10.3 mg/dL  BASIC METABOLIC PANEL WITH GFR     Status: Abnormal   Collection Time: 12/14/18  8:34 AM  Result Value Ref Range   Glucose, Bld 310 (H) 65 - 99 mg/dL    Comment: .            Fasting reference interval . For someone without known diabetes, a glucose value >125 mg/dL indicates that they may have diabetes and this should be confirmed with a follow-up test. .    BUN 11 7 - 25 mg/dL   Creat 0.64 0.60 - 1.35 mg/dL   GFR, Est Non African American 115 > OR = 60  mL/min/1.20m2   GFR, Est African American 133 > OR = 60 mL/min/1.33m2   BUN/Creatinine Ratio NOT APPLICABLE 6 - 22 (calc)   Sodium 136 135 - 146 mmol/L   Potassium 3.7 3.5 - 5.3 mmol/L   Chloride 93 (L) 98 - 110 mmol/L   CO2 31 20 - 32 mmol/L   Calcium 9.3 8.6 - 10.3 mg/dL    -------------------------------------------------------------------------- A&P:  Problem List Items Addressed This Visit    Hypertension  Seems improved control Continue current med  with atenolol-chlorthalidone Monitor BP trend Cr stable on lab    Relevant Medications   atenolol-chlorthalidone (TENORETIC) 100-25 MG tablet    Other Visit Diagnoses    Hyperglycemia    -  Primary     Significant concern with x 2 uncertain fasting readings glucose >300, technically meets criteria for type 2 diabetes on separate lab results, will anticipate A1c in near future for confirmation and actual understanding of severity of type 2 diabetes if Identified. - Return to office within 1 week for POC A1c nurse visit, follow up results once reviewed - Discussed today metformin as initial therapy, discussed dosing benefit side effect, and anticipate will rx at that point, cost is issue for patient uninsured so likely anticipate Metformin first and then in future consider sulfonylurea or we can work on patient assistance for uninsured for GLP1 medication in future.  Next visit likely 3 months after this A1c  Meds ordered this encounter  Medications  . atenolol-chlorthalidone (TENORETIC) 100-25 MG tablet    Sig: Take 1 tablet by mouth daily.    Dispense:  30 tablet    Refill:  5    Follow-up: - Return in 1 week for POC A1c nurse only / then 3 months for A1c  Patient verbalizes understanding with the above medical recommendations including the limitation of remote medical advice.  Specific follow-up and call-back criteria were given for patient to follow-up or seek medical care more urgently if needed.   - Time spent in  direct consultation with patient on phone: 8 minutes   Saralyn Pilar, DO [facility] 12/15/2018, 11:58 AM

## 2018-12-15 NOTE — Patient Instructions (Signed)
AVS given by phone. 

## 2018-12-21 ENCOUNTER — Other Ambulatory Visit (INDEPENDENT_AMBULATORY_CARE_PROVIDER_SITE_OTHER): Payer: Self-pay

## 2018-12-21 ENCOUNTER — Other Ambulatory Visit: Payer: Self-pay | Admitting: Family Medicine

## 2018-12-21 ENCOUNTER — Other Ambulatory Visit: Payer: Self-pay

## 2018-12-21 DIAGNOSIS — R7309 Other abnormal glucose: Secondary | ICD-10-CM

## 2018-12-21 DIAGNOSIS — E1169 Type 2 diabetes mellitus with other specified complication: Secondary | ICD-10-CM

## 2018-12-21 LAB — POCT GLYCOSYLATED HEMOGLOBIN (HGB A1C): Hemoglobin A1C: 14 % — AB (ref 4.0–5.6)

## 2018-12-21 MED ORDER — METFORMIN HCL 500 MG PO TABS: 500.0000 mg | ORAL_TABLET | Freq: Two times a day (BID) | ORAL | 0 refills | Status: DC

## 2018-12-21 NOTE — Progress Notes (Signed)
Steve Welch  

## 2019-02-01 ENCOUNTER — Ambulatory Visit (INDEPENDENT_AMBULATORY_CARE_PROVIDER_SITE_OTHER): Payer: Medicaid Other | Admitting: Family Medicine

## 2019-02-01 ENCOUNTER — Other Ambulatory Visit: Payer: Self-pay

## 2019-02-01 ENCOUNTER — Encounter: Payer: Self-pay | Admitting: Family Medicine

## 2019-02-01 DIAGNOSIS — E1169 Type 2 diabetes mellitus with other specified complication: Secondary | ICD-10-CM

## 2019-02-01 NOTE — Progress Notes (Signed)
Virtual Visit via Telephone The purpose of this virtual visit is to provide medical care while limiting exposure to the novel coronavirus (COVID19) for both patient and office staff.  Consent was obtained for phone visit:  Yes.   Answered questions that patient had about telehealth interaction:  Yes.   I discussed the limitations, risks, security and privacy concerns of performing an evaluation and management service by telephone. I also discussed with the patient that there may be a patient responsible charge related to this service. The patient expressed understanding and agreed to proceed.  Patient Location: Home Provider Location: [facility])  ---------------------------------------------------------------------- Chief Complaint  Patient presents with  . Diabetes    S: Reviewed CMA documentation. I have called patient and gathered additional HPI as follows:  CHRONIC DM, Type 2: - Last visit with me 11/2018, for same problem diabetes, treated with new start metformin, see prior notes for background information. - Interval update with doing better, last visit he was started on Metformin 500mg  - Today patient reports he was able to gradually increase Metformin 500mg  started daily then up to gradually up to 1 pill twice a day now, had some GI upset but now tolerating better. - He feels better overall. - Now approved Medicaid - CBGs: Avg avg 190, Low >150, High < 300. Checks CBGs few times daily Meds: Metformin 500mg  BID Reports good compliance. Tolerating well w/o side-effects Currently not on ACEi / ARB Lifestyle: - Weight loss overall, with good results. - Diet (reduced starches carbs, drinking more water)  - Exercise (improving but limited by back knees) Denies hypoglycemia, polyuria, visual changes, numbness or tingling.   Denies any high risk travel to areas of current concern for COVID19. Denies any known or suspected exposure to person with or  possibly with COVID19.  Denies any fevers, chills, sweats, body ache, cough, shortness of breath, sinus pain or pressure, headache, abdominal pain, diarrhea  Past Medical History:  Diagnosis Date  . Allergy   . Anxiety   . Arthritis    knees and shoulders  . Depression   . GERD (gastroesophageal reflux disease)   . Hiatal hernia   . Hyperlipidemia   . Hypertension   . Peptic ulcer    gastric and duodenal   Social History   Tobacco Use  . Smoking status: Never Smoker  . Smokeless tobacco: Current User    Types: Snuff  . Tobacco comment: started 18 x 2 years, 2010 1 can every 3 days  Substance Use Topics  . Alcohol use: No  . Drug use: No    Current Outpatient Medications:  .  Ascorbic Acid (VITAMIN C) 100 MG tablet, Take 100 mg by mouth daily., Disp: , Rfl:  .  atenolol-chlorthalidone (TENORETIC) 100-25 MG tablet, Take 1 tablet by mouth daily., Disp: 30 tablet, Rfl: 5 .  busPIRone (BUSPAR) 10 MG tablet, Take 1 tablet (10 mg total) by mouth 2 (two) times daily., Disp: 60 tablet, Rfl: 2 .  cetirizine (ZYRTEC) 10 MG tablet, Take 10 mg by mouth daily., Disp: , Rfl:  .  Glucosamine Sulfate 1000 MG CAPS, Take by mouth., Disp: , Rfl:  .  meloxicam (MOBIC) 15 MG tablet, Take 1 tablet (15 mg total) by mouth daily., Disp: 30 tablet, Rfl: 0 .  metFORMIN (GLUCOPHAGE) 500 MG tablet, Take 1 tablet (500 mg total) by mouth 2 (two) times daily with a meal., Disp: 180 tablet, Rfl: 0 .  Multiple Vitamin (MULTIVITAMIN) tablet, Take 1 tablet by  mouth daily., Disp: , Rfl:  .  omeprazole (PRILOSEC) 20 MG capsule, Take 20 mg by mouth 2 (two) times daily before a meal. , Disp: , Rfl:  .  OVER THE COUNTER MEDICATION, Take 1 capsule by mouth daily. [place], Disp: , Rfl:  .  vitamin B-12 (CYANOCOBALAMIN) 100 MCG tablet, Take 100 mcg by mouth daily., Disp: , Rfl:  .  vitamin E 100 UNIT capsule, Take 100 Units by mouth daily., Disp: , Rfl:   Depression screen [facility] 2/9 12/15/2018 09/09/2018 03/24/2018   Decreased Interest 0 1 1  Down, Depressed, Hopeless 1 2 2   PHQ - 2 Score 1 3 3   Altered sleeping 2 2 3   Tired, decreased energy 0 2 1  Change in appetite 0 0 0  Feeling bad or failure about yourself  2 2 3   Trouble concentrating 0 0 0  Moving slowly or fidgety/restless 0 0 0  Suicidal thoughts 0 0 0  PHQ-9 Score 5 9 10   Difficult doing work/chores Not difficult at all Somewhat difficult Not difficult at all    GAD 7 : Generalized Anxiety Score 12/15/2018 09/09/2018  Nervous, Anxious, on Edge 0 2  Control/stop worrying 1 2  Worry too much - different things 2 2  Trouble relaxing 0 2  Restless 0 2  Easily annoyed or irritable 1 1  Afraid - awful might happen 1 1  Total GAD 7 Score 5 12  Anxiety Difficulty Somewhat difficult Not difficult at all    -------------------------------------------------------------------------- O: No physical exam performed due to remote telephone encounter.  Lab results reviewed.  Recent Results (from the past 2160 hour(s))  BASIC METABOLIC PANEL WITH GFR     Status: Abnormal   Collection Time: 12/14/18  8:34 AM  Result Value Ref Range   Glucose, Bld 310 (H) 65 - 99 mg/dL    Comment: .            Fasting reference interval . For someone without known diabetes, a glucose value >125 mg/dL indicates that they may have diabetes and this should be confirmed with a follow-up test. .    BUN 11 7 - 25 mg/dL   Creat 0.64 0.60 - 1.35 mg/dL   GFR, Est Non African American 115 > OR = 60 mL/min/1.5m2   GFR, Est African American 133 > OR = 60 mL/min/1.89m2   BUN/Creatinine Ratio NOT APPLICABLE 6 - 22 (calc)   Sodium 136 135 - 146 mmol/L   Potassium 3.7 3.5 - 5.3 mmol/L   Chloride 93 (L) 98 - 110 mmol/L   CO2 31 20 - 32 mmol/L   Calcium 9.3 8.6 - 10.3 mg/dL  POCT glycosylated hemoglobin (Hb A1C)     Status: Abnormal   Collection Time: 12/21/18 11:46 AM  Result Value Ref Range   Hemoglobin A1C 14.0 (A) 4.0 - 5.6 %    Comment: >14.0%   HbA1c POC  (<> result, manual entry)     HbA1c, POC (prediabetic range)     HbA1c, POC (controlled diabetic range)      -------------------------------------------------------------------------- A&P:  Problem List Items Addressed This Visit    Type 2 diabetes mellitus with other specified complication ([place]) - Primary    Uncontrolled DM with hyperglycemia, A1c >14 prior not due yet for repeat, now on therapy  Seems improving control based on CBG readings Complications - hyperglycemia obesity, GERD  Plan:  1. Keep adjusting metformin therapy can increase from Metformin 500 BID now up to 1000mg  BID as  tolerated 2. Encourage improved lifestyle - low carb, low sugar diet, reduce portion size, continue improving start regular exercise 3. Check CBG, bring log to next visit for review         No orders of the defined types were placed in this encounter.   Follow-up: - Return in 3 months for DM A1c w/ me  Patient verbalizes understanding with the above medical recommendations including the limitation of remote medical advice.  Specific follow-up and call-back criteria were given for patient to follow-up or seek medical care more urgently if needed.   - Time spent in direct consultation with patient on phone: 9 minutes   Saralyn PilarAlexander Cookie Pore, DO [facility] 02/01/2019, 11:57 AM

## 2019-02-01 NOTE — Patient Instructions (Addendum)
  Please schedule a Follow-up Appointment to: Return in about 3 months (around 05/02/2019) for 3 month DM A1c w/ me.  If you have any other questions or concerns, please feel free to call the office or send a message through [place]. You may also schedule an earlier appointment if necessary.  Additionally, you may be receiving a survey about your experience at our office within a few days to 1 week by e-mail or mail. We value your feedback.  Nobie Putnam, DO [place]

## 2019-02-02 NOTE — Assessment & Plan Note (Addendum)
Uncontrolled DM with hyperglycemia, A1c >14 prior not due yet for repeat, now on therapy  Seems improving control based on CBG readings Complications - hyperglycemia obesity, GERD  Plan:  1. Keep adjusting metformin therapy can increase from Metformin 500 BID now up to 1000mg  BID as tolerated 2. Encourage improved lifestyle - low carb, low sugar diet, reduce portion size, continue improving start regular exercise 3. Check CBG, bring log to next visit for review

## 2019-06-14 ENCOUNTER — Other Ambulatory Visit: Payer: Self-pay | Admitting: Family Medicine

## 2019-06-14 DIAGNOSIS — I10 Essential (primary) hypertension: Secondary | ICD-10-CM

## 2019-06-14 NOTE — Telephone Encounter (Signed)
Requested Prescriptions  Pending Prescriptions Disp Refills  . atenolol-chlorthalidone (TENORETIC) 100-25 MG tablet [Pharmacy Med Name: Atenolol-Chlorthalidone 100-25 MG Oral Tablet] 90 tablet 0    Sig: Take 1 tablet by mouth once daily     Cardiovascular: Beta Blocker + Diuretic Combos Failed - 06/14/2019  9:55 AM      Failed - K in normal range and within 180 days    Potassium  Date Value Ref Range Status  12/14/2018 3.7 3.5 - 5.3 mmol/L Final         Failed - Na in normal range and within 180 days    Sodium  Date Value Ref Range Status  12/14/2018 136 135 - 146 mmol/L Final         Failed - Cr in normal range and within 180 days    Creat  Date Value Ref Range Status  12/14/2018 0.64 0.60 - 1.35 mg/dL Final         Failed - Ca in normal range and within 180 days    Calcium  Date Value Ref Range Status  12/14/2018 9.3 8.6 - 10.3 mg/dL Final         Passed - Patient is not pregnant      Passed - Last BP in normal range    BP Readings from Last 1 Encounters:  12/15/18 125/80         Passed - Last Heart Rate in normal range    Pulse Readings from Last 1 Encounters:  12/15/18 72         Passed - Valid encounter within last 6 months    Recent Outpatient Visits          4 months ago Type 2 diabetes mellitus with other specified complication, without long-term current use of insulin (HCC)   [facility] [place], Netta Neat, DO   6 months ago Hyperglycemia   [facility] Smitty Cords, DO   9 months ago Anxiety   [facility] Kyung Rudd, Alison Stalling, NP   1 year ago Essential hypertension   [facility] Kyung Rudd, Alison Stalling, NP   2 years ago Essential hypertension   [facility] Kyung Rudd, Alison Stalling, NP

## 2019-07-03 ENCOUNTER — Telehealth: Payer: Medicaid Other | Admitting: Family Medicine

## 2019-07-03 ENCOUNTER — Other Ambulatory Visit: Payer: Self-pay

## 2019-07-04 ENCOUNTER — Ambulatory Visit: Payer: Medicaid Other | Admitting: Family Medicine

## 2019-07-10 ENCOUNTER — Ambulatory Visit: Payer: Medicaid Other | Admitting: Family Medicine

## 2019-07-11 ENCOUNTER — Ambulatory Visit: Payer: Medicaid Other | Admitting: Family Medicine

## 2019-07-18 ENCOUNTER — Other Ambulatory Visit: Payer: Self-pay

## 2019-07-18 ENCOUNTER — Ambulatory Visit (INDEPENDENT_AMBULATORY_CARE_PROVIDER_SITE_OTHER): Payer: Medicaid Other | Admitting: Family Medicine

## 2019-07-18 ENCOUNTER — Encounter: Payer: Self-pay | Admitting: Family Medicine

## 2019-07-18 VITALS — BP 137/89 | HR 102 | Temp 98.2°F | Resp 16 | Ht 79.0 in | Wt 345.0 lb

## 2019-07-18 DIAGNOSIS — M172 Bilateral post-traumatic osteoarthritis of knee: Secondary | ICD-10-CM | POA: Diagnosis not present

## 2019-07-18 DIAGNOSIS — E1165 Type 2 diabetes mellitus with hyperglycemia: Secondary | ICD-10-CM | POA: Diagnosis not present

## 2019-07-18 DIAGNOSIS — F3341 Major depressive disorder, recurrent, in partial remission: Secondary | ICD-10-CM

## 2019-07-18 DIAGNOSIS — F331 Major depressive disorder, recurrent, moderate: Secondary | ICD-10-CM | POA: Insufficient documentation

## 2019-07-18 DIAGNOSIS — I10 Essential (primary) hypertension: Secondary | ICD-10-CM

## 2019-07-18 DIAGNOSIS — Z6839 Body mass index (BMI) 39.0-39.9, adult: Secondary | ICD-10-CM

## 2019-07-18 MED ORDER — TRULICITY 0.75 MG/0.5ML ~~LOC~~ SOAJ: 0.7500 mg | SUBCUTANEOUS | 2 refills | Status: DC

## 2019-07-18 MED ORDER — MELOXICAM 15 MG PO TABS: 15.0000 mg | ORAL_TABLET | Freq: Every day | ORAL | 5 refills | Status: DC | PRN

## 2019-07-18 MED ORDER — ATENOLOL-CHLORTHALIDONE 100-25 MG PO TABS: 1.0000 | ORAL_TABLET | Freq: Every day | ORAL | 5 refills | Status: DC

## 2019-07-18 MED ORDER — METFORMIN HCL 500 MG PO TABS: 500.0000 mg | ORAL_TABLET | Freq: Two times a day (BID) | ORAL | 5 refills | Status: DC

## 2019-07-18 NOTE — Patient Instructions (Addendum)
Thank you for coming to the office today.  Start new Trulicity 0.75mg  once weekly  - 2 week sample given today, already ordered month supply to pharmacy, it is now on medicaid preferred should be covered.  Continue metformin 500 twice a day for now.  Refilled other meds  Send eye exam report to [place], give  Eye our information.   Please schedule a Follow-up Appointment to: Return in about 3 months (around 10/18/2019) for 3 month DM virtual after lab A1c.  If you have any other questions or concerns, please feel free to call the office or send a message through MyChart. You may also schedule an earlier appointment if necessary.  Additionally, you may be receiving a survey about your experience at our office within a few days to 1 week by e-mail or mail. We value your feedback.  Saralyn Pilar, DO [facility], [place]

## 2019-07-18 NOTE — Progress Notes (Signed)
Subjective:    Patient ID: Steve Welch, male    DOB: [birth year], 49 y.o.   MRN: [id number]  Steve Welch is a 49 y.o. male presenting on 07/18/2019 for Hypertension   HPI   CHRONIC DM, Type 2 HTN Morbid Obesity BMI >38 - New dx 11/2018 - No prior medicines Cannot tolerate increased doses of Metformin 500mg  twice a day, otherwise would have side effects of upset stomach GI intolerance. - Admits increased urine output. He is staying hydrated overall with clear fluids. - Next scheduled [facility] eye apt within 1 month, will send record CBG avg 200-300. Uncertain exact readings. No meter here today. Diet - limited most carbs / starches, no sodas sweet tea Admits polyuria Currently not on ACEi / ARB Lifestyle: - Diet (reduced starches carbs, drinking more water)  - Exercise (improving but limited by back knees - wheelchair bound Denies hypoglycemia, polyuria, visual changes, numbness or tingling.  Major Depression in partial remission / Anxiety Currently no new concerns. He is on Buspar  Depression screen [facility] 2/9 07/18/2019 12/15/2018 09/09/2018  Decreased Interest 2 0 1  Down, Depressed, Hopeless 2 1 2   PHQ - 2 Score 4 1 3   Altered sleeping 3 2 2   Tired, decreased energy 1 0 2  Change in appetite 1 0 0  Feeling bad or failure about yourself  3 2 2   Trouble concentrating 0 0 0  Moving slowly or fidgety/restless 0 0 0  Suicidal thoughts 0 0 0  PHQ-9 Score 12 5 9   Difficult doing work/chores Somewhat difficult Not difficult at all Somewhat difficult   GAD 7 : Generalized Anxiety Score 07/18/2019 12/15/2018 09/09/2018  Nervous, Anxious, on Edge 0 0 2  Control/stop worrying 3 1 2   Worry too much - different things 1 2 2   Trouble relaxing 1 0 2  Restless 0 0 2  Easily annoyed or irritable 1 1 1   Afraid - awful might happen 1 1 1   Total GAD 7 Score 7 5 12   Anxiety Difficulty Somewhat difficult Somewhat difficult Not difficult at all     Social History   Tobacco  Use  . Smoking status: Never Smoker  . Smokeless tobacco: Current User    Types: Snuff  . Tobacco comment: started 18 x 2 years, 2010 1 can every 3 days  Substance Use Topics  . Alcohol use: No  . Drug use: No    Review of Systems Per HPI unless specifically indicated above     Objective:    BP 137/89   Pulse (!) 102   Temp 98.2 F (36.8 C) (Temporal)   Resp 16   Ht 6\' 7"  (2.007 m)   Wt (!) 345 lb (156.5 kg) Comment: estimate  SpO2 98%   BMI 38.87 kg/m   Wt Readings from Last 3 Encounters:  07/18/19 (!) 345 lb (156.5 kg)  03/24/18 (!) 340 lb 8 oz (154.4 kg)  02/25/17 (!) 343 lb (155.6 kg)    Physical Exam Vitals and nursing note reviewed.  Constitutional:      General: He is not in acute distress.    Appearance: He is well-developed. He is obese. He is not diaphoretic.     Comments: Well-appearing, comfortable, cooperative, wheelchair bound  HENT:     Head: Normocephalic and atraumatic.  Eyes:     General:        Right eye: No discharge.        Left eye: No discharge.  Conjunctiva/sclera: Conjunctivae normal.  Neck:     Thyroid: No thyromegaly.  Cardiovascular:     Rate and Rhythm: Normal rate and regular rhythm.     Heart sounds: Normal heart sounds. No murmur.  Pulmonary:     Effort: Pulmonary effort is normal. No respiratory distress.     Breath sounds: Normal breath sounds. No wheezing or rales.  Musculoskeletal:        General: Normal range of motion.     Cervical back: Normal range of motion and neck supple.     Right lower leg: Edema present.     Left lower leg: Edema present.  Lymphadenopathy:     Cervical: No cervical adenopathy.  Skin:    General: Skin is warm and dry.     Findings: No erythema or rash.  Neurological:     Mental Status: He is alert and oriented to person, place, and time.  Psychiatric:        Behavior: Behavior normal.     Comments: Well groomed, good eye contact, normal speech and thoughts      Diabetic Foot Exam -  Simple   Simple Foot Form Diabetic Foot exam was performed with the following findings: Yes 07/18/2019  1:50 PM  Visual Inspection See comments: Yes Sensation Testing Intact to touch and monofilament testing bilaterally: Yes Pulse Check Posterior Tibialis and Dorsalis pulse intact bilaterally: Yes Comments Bilateral feet with some callus formation without ulceration, thickened toenail R side. Monofilament sensation intact.    Recent Labs    12/21/18 1146 07/19/19 0814  HGBA1C 14.0* 14.0*     Results for orders placed or performed in visit on 07/18/19  POCT HgB A1C  Result Value Ref Range   Hemoglobin A1C 14.0 (A) 4.0 - 5.6 %      Assessment & Plan:   Problem List Items Addressed This Visit    Uncontrolled type 2 diabetes mellitus with hyperglycemia, without long-term current use of insulin ([place]) - Primary    Uncontrolled DM with hyperglycemia, A1c >14 still despite metformin therapy on low dose Improved diet but not able to exercise Seems improving control based on CBG readings Complications - hyperglycemia obesity, GERD  Plan:  1. START Trulicity 0.75mg  weekly injection GLP1 review benefit risk, A1c wt loss, appetite - free sample 2 pen and ordered today since on Medicaid PDL 07/18/19. Likely dose inc in 3 months - Keep low dose metformin 500 BID for now 2. Encourage improved lifestyle - low carb, low sugar diet, reduce portion size, continue improving start regular exercise 3. Check CBG, bring log to next visit for review  Future consider Endocrinology if unsuccessful      Relevant Medications   TRULICITY 1.44 RX/5.4MG SOPN   metFORMIN (GLUCOPHAGE) 500 MG tablet   atenolol-chlorthalidone (TENORETIC) 100-25 MG tablet   Other Relevant Orders   POCT HgB A1C (Completed)   Recurrent major depressive disorder, in partial remission (HCC)    Currently mild elevated PHQ but overall admits partial remission of symptoms On buspar Not on SSRI currently No new changes needed  today      Osteoarthritis of both knees   Relevant Medications   meloxicam (MOBIC) 15 MG tablet   Hypertension    Well-controlled HTN Obesity   Plan:  1. Continue current BP regimen no changes today 2. Encourage improved lifestyle - low sodium diet, regular exercise 3. Start monitor BP outside office, bring readings to next visit, if persistently >140/90 or new symptoms notify office sooner  Relevant Medications   atenolol-chlorthalidone (TENORETIC) 100-25 MG tablet   Class 2 severe obesity due to excess calories with serious comorbidity and body mass index (BMI) of 39.0 to 39.9 in adult [facility])    See A&P Diabetes Complicated by HTN HLD, OA      Relevant Medications   TRULICITY 0.75 MG/0.5ML SOPN   metFORMIN (GLUCOPHAGE) 500 MG tablet        Meds ordered this encounter  Medications  . TRULICITY 0.75 MG/0.5ML SOPN    Sig: Inject 0.75 mg into the skin once a week.    Dispense:  2 mL    Refill:  2  . meloxicam (MOBIC) 15 MG tablet    Sig: Take 1 tablet (15 mg total) by mouth daily as needed for pain.    Dispense:  30 tablet    Refill:  5  . metFORMIN (GLUCOPHAGE) 500 MG tablet    Sig: Take 1 tablet (500 mg total) by mouth 2 (two) times daily with a meal.    Dispense:  60 tablet    Refill:  5  . atenolol-chlorthalidone (TENORETIC) 100-25 MG tablet    Sig: Take 1 tablet by mouth daily.    Dispense:  30 tablet    Refill:  5      Follow up plan: Return in about 3 months (around 10/18/2019) for 3 month DM virtual after lab A1c.    Saralyn Pilar, DO [facility] 07/18/2019, 1:41 PM

## 2019-07-19 ENCOUNTER — Other Ambulatory Visit: Payer: Self-pay | Admitting: Family Medicine

## 2019-07-19 DIAGNOSIS — E1165 Type 2 diabetes mellitus with hyperglycemia: Secondary | ICD-10-CM

## 2019-07-19 LAB — POCT GLYCOSYLATED HEMOGLOBIN (HGB A1C): Hemoglobin A1C: 14 % — AB (ref 4.0–5.6)

## 2019-07-19 NOTE — Assessment & Plan Note (Signed)
See A&P Diabetes Complicated by HTN HLD, OA

## 2019-07-19 NOTE — Assessment & Plan Note (Signed)
Uncontrolled DM with hyperglycemia, A1c >14 still despite metformin therapy on low dose Improved diet but not able to exercise Seems improving control based on CBG readings Complications - hyperglycemia obesity, GERD  Plan:  1. START Trulicity 0.75mg  weekly injection GLP1 review benefit risk, A1c wt loss, appetite - free sample 2 pen and ordered today since on Medicaid PDL 07/18/19. Likely dose inc in 3 months - Keep low dose metformin 500 BID for now 2. Encourage improved lifestyle - low carb, low sugar diet, reduce portion size, continue improving start regular exercise 3. Check CBG, bring log to next visit for review  Future consider Endocrinology if unsuccessful

## 2019-07-19 NOTE — Assessment & Plan Note (Signed)
Currently mild elevated PHQ but overall admits partial remission of symptoms On buspar Not on SSRI currently No new changes needed today

## 2019-07-19 NOTE — Assessment & Plan Note (Signed)
Well-controlled HTN Obesity   Plan:  1. Continue current BP regimen no changes today 2. Encourage improved lifestyle - low sodium diet, regular exercise 3. Start monitor BP outside office, bring readings to next visit, if persistently >140/90 or new symptoms notify office sooner

## 2019-07-25 LAB — HM DIABETES EYE EXAM

## 2019-07-27 ENCOUNTER — Encounter: Payer: Self-pay | Admitting: Family Medicine

## 2019-10-20 ENCOUNTER — Other Ambulatory Visit: Payer: Self-pay | Admitting: Family Medicine

## 2019-10-20 ENCOUNTER — Other Ambulatory Visit: Payer: Self-pay | Admitting: *Deleted

## 2019-10-20 ENCOUNTER — Ambulatory Visit: Payer: Medicaid Other | Admitting: Family Medicine

## 2019-10-20 DIAGNOSIS — E1169 Type 2 diabetes mellitus with other specified complication: Secondary | ICD-10-CM

## 2019-10-20 DIAGNOSIS — E1165 Type 2 diabetes mellitus with hyperglycemia: Secondary | ICD-10-CM

## 2019-10-20 DIAGNOSIS — I10 Essential (primary) hypertension: Secondary | ICD-10-CM

## 2019-10-20 DIAGNOSIS — E785 Hyperlipidemia, unspecified: Secondary | ICD-10-CM | POA: Insufficient documentation

## 2019-10-20 DIAGNOSIS — M172 Bilateral post-traumatic osteoarthritis of knee: Secondary | ICD-10-CM

## 2019-10-20 MED ORDER — ATENOLOL-CHLORTHALIDONE 100-25 MG PO TABS: 1.0000 | ORAL_TABLET | Freq: Every day | ORAL | 0 refills | Status: DC

## 2019-10-20 MED ORDER — MELOXICAM 15 MG PO TABS: 15.0000 mg | ORAL_TABLET | Freq: Every day | ORAL | 0 refills | Status: DC | PRN

## 2019-10-24 ENCOUNTER — Other Ambulatory Visit: Payer: Medicaid Other

## 2019-10-30 ENCOUNTER — Other Ambulatory Visit: Payer: Self-pay | Admitting: Family Medicine

## 2019-10-30 DIAGNOSIS — E1165 Type 2 diabetes mellitus with hyperglycemia: Secondary | ICD-10-CM

## 2019-12-11 ENCOUNTER — Other Ambulatory Visit: Payer: Self-pay | Admitting: Family Medicine

## 2019-12-11 DIAGNOSIS — E1165 Type 2 diabetes mellitus with hyperglycemia: Secondary | ICD-10-CM

## 2019-12-11 NOTE — Telephone Encounter (Signed)
Approved per protocol.  Requested Prescriptions  Pending Prescriptions Disp Refills   TRULICITY 0.75 MG/0.5ML SOPN [Pharmacy Med Name: Trulicity 0.75 MG/0.5ML Subcutaneous Solution Pen-injector] 4 mL 0    Sig: INJECT 0.75MG  SUBCUTANEOUSLY ONCE A WEEK     Endocrinology:  Diabetes - GLP-1 Receptor Agonists Failed - 12/11/2019  5:00 PM      Failed - HBA1C is between 0 and 7.9 and within 180 days    Hemoglobin A1C  Date Value Ref Range Status  07/19/2019 14.0 (A) 4.0 - 5.6 % Final         Passed - Valid encounter within last 6 months    Recent Outpatient Visits          4 months ago Uncontrolled type 2 diabetes mellitus with hyperglycemia, without long-term current use of insulin [facility])   [facility], Netta Neat, DO   10 months ago Type 2 diabetes mellitus with other specified complication, without long-term current use of insulin [facility])   [facility] Smitty Cords, DO   12 months ago Hyperglycemia   [facility] [place], Netta Neat, DO   1 year ago Anxiety   [facility] Kyung Rudd, Alison Stalling, NP   1 year ago Essential hypertension   [facility] Kyung Rudd, Alison Stalling, NP

## 2020-02-02 ENCOUNTER — Telehealth: Payer: Self-pay | Admitting: Family Medicine

## 2020-02-02 DIAGNOSIS — I10 Essential (primary) hypertension: Secondary | ICD-10-CM

## 2020-02-02 NOTE — Telephone Encounter (Signed)
Requested medication (s) are due for refill today - yes  Requested medication (s) are on the active medication list -yes  Future visit scheduled -no  Last refill: 01/15/20  Notes to clinic: Attempted to call patient to schedule appointment- "call can not be completed at this time" message- unable to contact. Request sent for review   Requested Prescriptions  Pending Prescriptions Disp Refills   atenolol-chlorthalidone (TENORETIC) 100-25 MG tablet [Pharmacy Med Name: Atenolol-Chlorthalidone 100-25 MG Oral Tablet] 30 tablet 0    Sig: Take 1 tablet by mouth once daily      Cardiovascular: Beta Blocker + Diuretic Combos Failed - 02/02/2020  1:11 PM      Failed - K in normal range and within 180 days    Potassium  Date Value Ref Range Status  12/14/2018 3.7 3.5 - 5.3 mmol/L Final          Failed - Na in normal range and within 180 days    Sodium  Date Value Ref Range Status  12/14/2018 136 135 - 146 mmol/L Final          Failed - Cr in normal range and within 180 days    Creat  Date Value Ref Range Status  12/14/2018 0.64 0.60 - 1.35 mg/dL Final          Failed - Ca in normal range and within 180 days    Calcium  Date Value Ref Range Status  12/14/2018 9.3 8.6 - 10.3 mg/dL Final          Failed - Valid encounter within last 6 months    Recent Outpatient Visits           6 months ago Uncontrolled type 2 diabetes mellitus with hyperglycemia, without long-term current use of insulin (HCC)   [facility] Smitty Cords, DO   1 year ago Type 2 diabetes mellitus with other specified complication, without long-term current use of insulin (HCC)   [facility] Smitty Cords, DO   1 year ago Hyperglycemia   [facility] [place], Netta Neat, DO   1 year ago Anxiety   [facility] Kyung Rudd, Alison Stalling, NP   1 year ago Essential hypertension   [facility] Kyung Rudd, Alison Stalling, NP                Passed - Patient is not pregnant      Passed - Last BP in normal range    BP Readings from Last 1 Encounters:  07/18/19 137/89          Passed - Last Heart Rate in normal range    Pulse Readings from Last 1 Encounters:  07/18/19 (!) 102              Requested Prescriptions  Pending Prescriptions Disp Refills   atenolol-chlorthalidone (TENORETIC) 100-25 MG tablet [Pharmacy Med Name: Atenolol-Chlorthalidone 100-25 MG Oral Tablet] 30 tablet 0    Sig: Take 1 tablet by mouth once daily      Cardiovascular: Beta Blocker + Diuretic Combos Failed - 02/02/2020  1:11 PM      Failed - K in normal range and within 180 days    Potassium  Date Value Ref Range Status  12/14/2018 3.7 3.5 - 5.3 mmol/L Final          Failed - Na in normal range and within 180 days    Sodium  Date Value Ref Range Status  12/14/2018 136 135 - 146 mmol/L Final          Failed - Cr in normal range and within 180 days    Creat  Date Value Ref Range Status  12/14/2018 0.64 0.60 - 1.35 mg/dL Final          Failed - Ca in normal range and within 180 days    Calcium  Date Value Ref Range Status  12/14/2018 9.3 8.6 - 10.3 mg/dL Final          Failed - Valid encounter within last 6 months    Recent Outpatient Visits           6 months ago Uncontrolled type 2 diabetes mellitus with hyperglycemia, without long-term current use of insulin (HCC)   [facility], Netta Neat, DO   1 year ago Type 2 diabetes mellitus with other specified complication, without long-term current use of insulin [facility])   [facility] Smitty Cords, DO   1 year ago Hyperglycemia   [facility] Smitty Cords, DO   1 year ago Anxiety   [facility] Galen Manila, NP   1 year ago Essential hypertension   [facility] Galen Manila, NP                Passed -  Patient is not pregnant      Passed - Last BP in normal range    BP Readings from Last 1 Encounters:  07/18/19 137/89          Passed - Last Heart Rate in normal range    Pulse Readings from Last 1 Encounters:  07/18/19 (!) 102

## 2020-03-15 ENCOUNTER — Encounter: Payer: Self-pay | Admitting: Family Medicine

## 2020-03-15 ENCOUNTER — Other Ambulatory Visit: Payer: Self-pay

## 2020-03-15 ENCOUNTER — Telehealth (INDEPENDENT_AMBULATORY_CARE_PROVIDER_SITE_OTHER): Payer: Medicaid Other | Admitting: Family Medicine

## 2020-03-15 VITALS — BP 135/80 | HR 82 | Ht 79.0 in | Wt 300.0 lb

## 2020-03-15 DIAGNOSIS — E1165 Type 2 diabetes mellitus with hyperglycemia: Secondary | ICD-10-CM | POA: Diagnosis not present

## 2020-03-15 DIAGNOSIS — E669 Obesity, unspecified: Secondary | ICD-10-CM

## 2020-03-15 DIAGNOSIS — E1169 Type 2 diabetes mellitus with other specified complication: Secondary | ICD-10-CM | POA: Diagnosis not present

## 2020-03-15 DIAGNOSIS — E785 Hyperlipidemia, unspecified: Secondary | ICD-10-CM

## 2020-03-15 DIAGNOSIS — I10 Essential (primary) hypertension: Secondary | ICD-10-CM

## 2020-03-15 MED ORDER — ATENOLOL-CHLORTHALIDONE 100-25 MG PO TABS: 1.0000 | ORAL_TABLET | Freq: Every day | ORAL | 1 refills | Status: DC

## 2020-03-15 NOTE — Progress Notes (Signed)
Virtual Visit via Telephone The purpose of this virtual visit is to provide medical care while limiting exposure to the novel coronavirus (COVID19) for both patient and office staff.  Consent was obtained for phone visit:  Yes.   Answered questions that patient had about telehealth interaction:  Yes.   I discussed the limitations, risks, security and privacy concerns of performing an evaluation and management service by telephone. I also discussed with the patient that there may be a patient responsible charge related to this service. The patient expressed understanding and agreed to proceed.  Patient Location: Home Provider Location: [facility] (Office)  Participants in virtual visit: - Patient: Steve Welch - CMA: Burnell Blanks, CMA - Provider: Dr Althea Charon  ---------------------------------------------------------------------- Chief Complaint  Patient presents with  . Medication Refill  . Diabetes    S: Reviewed CMA documentation. I have called patient and gathered additional HPI as follows:  CHRONIC DM, Type 2 HTN Obesity BMI >33 Improved lifestyle overall CBG 114 today Wt 300 lbs, down 45 lbs in past 6 months He has dramatically limited carbs, starches, sugars in diet CBG 110-120s  Diet - limited most carbs / starches, no sodas sweet tea Admits polyuria Currentlynot onACEi / ARB Lifestyle: - Exercise (improving but limited by back knees - wheelchair bound Denies hypoglycemia, polyuria, visual changes, numbness or tingling.  Major Depression in partial remission / Anxiety Currently no new concerns. He is on Buspar  Knee Arthritis pain Already on Meloxicam Worse with winter weather, pain up to 5 out of 10, gradually worsening, difficult with ambulation up steps Advised him to try Voltaren gel topical  Denies any known or suspected exposure to person with or possibly with COVID19.  Denies any fevers, chills, sweats, body ache, cough,  shortness of breath, sinus pain or pressure, headache, abdominal pain, diarrhea  Declines COVID19 vaccine.  Past Medical History:  Diagnosis Date  . Allergy   . Anxiety   . Arthritis    knees and shoulders  . Depression   . GERD (gastroesophageal reflux disease)   . Hiatal hernia   . Hyperlipidemia   . Hypertension   . Peptic ulcer    gastric and duodenal   Social History   Tobacco Use  . Smoking status: Never Smoker  . Smokeless tobacco: Current User    Types: Snuff  . Tobacco comment: started 18 x 2 years, 2010 1 can every 3 days  Vaping Use  . Vaping Use: Never used  Substance Use Topics  . Alcohol use: No  . Drug use: No    Current Outpatient Medications:  .  Ascorbic Acid (VITAMIN C) 100 MG tablet, Take 100 mg by mouth daily., Disp: , Rfl:  .  busPIRone (BUSPAR) 10 MG tablet, Take 1 tablet (10 mg total) by mouth 2 (two) times daily., Disp: 60 tablet, Rfl: 2 .  cetirizine (ZYRTEC) 10 MG tablet, Take 10 mg by mouth daily., Disp: , Rfl:  .  Glucosamine Sulfate 1000 MG CAPS, Take by mouth., Disp: , Rfl:  .  meloxicam (MOBIC) 15 MG tablet, Take 1 tablet (15 mg total) by mouth daily as needed for pain., Disp: 30 tablet, Rfl: 0 .  metFORMIN (GLUCOPHAGE) 500 MG tablet, Take 1 tablet (500 mg total) by mouth 2 (two) times daily with a meal., Disp: 60 tablet, Rfl: 5 .  Multiple Vitamin (MULTIVITAMIN) tablet, Take 1 tablet by mouth daily., Disp: , Rfl:  .  omeprazole (PRILOSEC) 20 MG capsule, Take 20 mg  by mouth 2 (two) times daily before a meal. , Disp: , Rfl:  .  OVER THE COUNTER MEDICATION, Take 1 capsule by mouth daily. [place], Disp: , Rfl:  .  TRULICITY 0.75 MG/0.5ML SOPN, INJECT 0.75MG  SUBCUTANEOUSLY ONCE A WEEK, Disp: 4 mL, Rfl: 0 .  vitamin B-12 (CYANOCOBALAMIN) 100 MCG tablet, Take 100 mcg by mouth daily., Disp: , Rfl:  .  vitamin E 100 UNIT capsule, Take 100 Units by mouth daily., Disp: , Rfl:  .  atenolol-chlorthalidone (TENORETIC) 100-25 MG tablet, Take 1 tablet  by mouth daily., Disp: 90 tablet, Rfl: 1  Depression screen [facility] 2/9 07/18/2019 12/15/2018 09/09/2018  Decreased Interest 2 0 1  Down, Depressed, Hopeless 2 1 2   PHQ - 2 Score 4 1 3   Altered sleeping 3 2 2   Tired, decreased energy 1 0 2  Change in appetite 1 0 0  Feeling bad or failure about yourself  3 2 2   Trouble concentrating 0 0 0  Moving slowly or fidgety/restless 0 0 0  Suicidal thoughts 0 0 0  PHQ-9 Score 12 5 9   Difficult doing work/chores Somewhat difficult Not difficult at all Somewhat difficult    GAD 7 : Generalized Anxiety Score 07/18/2019 12/15/2018 09/09/2018  Nervous, Anxious, on Edge 0 0 2  Control/stop worrying 3 1 2   Worry too much - different things 1 2 2   Trouble relaxing 1 0 2  Restless 0 0 2  Easily annoyed or irritable 1 1 1   Afraid - awful might happen 1 1 1   Total GAD 7 Score 7 5 12   Anxiety Difficulty Somewhat difficult Somewhat difficult Not difficult at all    -------------------------------------------------------------------------- O: No physical exam performed due to remote telephone encounter.  BP 135/80   Pulse 82   Ht 6\' 7"  (2.007 m)   Wt 300 lb (136.1 kg)   SpO2 94%   BMI 33.80 kg/m    Lab results reviewed.  No results found for this or any previous visit (from the past 2160 hour(s)).  -------------------------------------------------------------------------- A&P:  Problem List Items Addressed This Visit    Uncontrolled type 2 diabetes mellitus with hyperglycemia, without long-term current use of insulin (HCC) - Primary    Improved DM control based on CBG, due for A1c, return for lab Improved diet but not able to exercise as much Seems improving control based on CBG readings Complications - hyperglycemia obesity, GERD  Plan:  1. Continue Trulicity 0.75mg  weekly inj for now - may adjust dose to 1.5 if tolerated when result of A1c comes back in 2 weeks, if >9 likely inc dose, if < 9 would hold at 0.75 - Keep low dose metformin 500  BID for now 2. Encourage improved lifestyle - low carb, low sugar diet, reduce portion size, continue improving start regular exercise 3. Check CBG, bring log to next visit for review      Relevant Medications   atenolol-chlorthalidone (TENORETIC) 100-25 MG tablet   Other Relevant Orders   Hemoglobin A1c   Obesity (BMI 30.0-34.9)    Encouraged weight loss      Relevant Orders   COMPLETE METABOLIC PANEL WITH GFR   Lipid panel   TSH   Hyperlipidemia associated with type 2 diabetes mellitus (HCC)   Relevant Medications   atenolol-chlorthalidone (TENORETIC) 100-25 MG tablet   Other Relevant Orders   Lipid panel   TSH   Essential hypertension   Relevant Medications   atenolol-chlorthalidone (TENORETIC) 100-25 MG tablet   Other Relevant Orders  CBC with Differential/Platelet   COMPLETE METABOLIC PANEL WITH GFR      Orders Placed This Encounter  Procedures  . Hemoglobin A1c    Standing Status:   Future    Standing Expiration Date:   06/16/2020  . CBC with Differential/Platelet    Standing Status:   Future    Standing Expiration Date:   06/16/2020  . COMPLETE METABOLIC PANEL WITH GFR    Standing Status:   Future    Standing Expiration Date:   06/16/2020  . Lipid panel    Standing Status:   Future    Standing Expiration Date:   06/16/2020    Order Specific Question:   Has the patient fasted?    Answer:   Yes  . TSH    Standing Status:   Future    Standing Expiration Date:   06/16/2020      Meds ordered this encounter  Medications  . atenolol-chlorthalidone (TENORETIC) 100-25 MG tablet    Sig: Take 1 tablet by mouth daily.    Dispense:  90 tablet    Refill:  1    Follow-up: - Return in 2-4 weeks for fasting lab only - will review results and adjust DM treatment if needed  Patient verbalizes understanding with the above medical recommendations including the limitation of remote medical advice.  Specific follow-up and call-back criteria were given for patient to  follow-up or seek medical care more urgently if needed.   - Time spent in direct consultation with patient on phone: 12 minutes   Steve Pilar, DO [facility] 03/15/2020, 11:28 AM

## 2020-03-16 NOTE — Assessment & Plan Note (Signed)
Improved DM control based on CBG, due for A1c, return for lab Improved diet but not able to exercise as much Seems improving control based on CBG readings Complications - hyperglycemia obesity, GERD  Plan:  1. Continue Trulicity 0.75mg  weekly inj for now - may adjust dose to 1.5 if tolerated when result of A1c comes back in 2 weeks, if >9 likely inc dose, if < 9 would hold at 0.75 - Keep low dose metformin 500 BID for now 2. Encourage improved lifestyle - low carb, low sugar diet, reduce portion size, continue improving start regular exercise 3. Check CBG, bring log to next visit for review

## 2020-03-16 NOTE — Assessment & Plan Note (Signed)
Encouraged weight loss 

## 2020-05-07 ENCOUNTER — Other Ambulatory Visit: Payer: Self-pay | Admitting: Family Medicine

## 2020-05-07 DIAGNOSIS — E1165 Type 2 diabetes mellitus with hyperglycemia: Secondary | ICD-10-CM

## 2020-05-07 NOTE — Telephone Encounter (Signed)
  Notes to clinic:  Review for continued use Script has expired    Requested Prescriptions  Pending Prescriptions Disp Refills   TRULICITY 0.75 MG/0.5ML SOPN [Pharmacy Med Name: Trulicity 0.75 MG/0.5ML Subcutaneous Solution Pen-injector] 4 mL 0    Sig: INJECT 0.75MG  UNDER THE SKIN ONCE PER WEEK      Endocrinology:  Diabetes - GLP-1 Receptor Agonists Failed - 05/07/2020 11:44 AM      Failed - HBA1C is between 0 and 7.9 and within 180 days    Hemoglobin A1C  Date Value Ref Range Status  07/19/2019 14.0 (A) 4.0 - 5.6 % Final          Passed - Valid encounter within last 6 months    Recent Outpatient Visits           1 month ago Uncontrolled type 2 diabetes mellitus with hyperglycemia, without long-term current use of insulin [facility])   [facility], Netta Neat, DO   9 months ago Uncontrolled type 2 diabetes mellitus with hyperglycemia, without long-term current use of insulin [facility])   [facility], Netta Neat, DO   1 year ago Type 2 diabetes mellitus with other specified complication, without long-term current use of insulin [facility])   [facility] [place], Netta Neat, DO   1 year ago Hyperglycemia   [facility] [place], Netta Neat, DO   1 year ago Anxiety   [facility] Kyung Rudd, Alison Stalling, NP

## 2020-07-30 ENCOUNTER — Other Ambulatory Visit: Payer: Self-pay | Admitting: Family Medicine

## 2020-07-30 DIAGNOSIS — E1165 Type 2 diabetes mellitus with hyperglycemia: Secondary | ICD-10-CM

## 2020-07-30 NOTE — Telephone Encounter (Signed)
Requested medication (s) are due for refill today: no  Requested medication (s) are on the active medication list: yes  Last refill: 05/18/2020  Future visit scheduled: no  Notes to clinic: Patient due for labs  Last labs 07/2019 Review for refill    Requested Prescriptions  Pending Prescriptions Disp Refills   metFORMIN (GLUCOPHAGE) 500 MG tablet [Pharmacy Med Name: metFORMIN HCl 500 MG Oral Tablet] 60 tablet 0    Sig: TAKE 1 TABLET BY MOUTH TWICE DAILY WITH A MEAL      Endocrinology:  Diabetes - Biguanides Failed - 07/30/2020  9:23 AM      Failed - Cr in normal range and within 360 days    Creat  Date Value Ref Range Status  12/14/2018 0.64 0.60 - 1.35 mg/dL Final          Failed - HBA1C is between 0 and 7.9 and within 180 days    Hemoglobin A1C  Date Value Ref Range Status  07/19/2019 14.0 (A) 4.0 - 5.6 % Final          Failed - eGFR in normal range and within 360 days    GFR, Est African American  Date Value Ref Range Status  12/14/2018 133 > OR = 60 mL/min/1.7m2 Final   GFR, Est Non African American  Date Value Ref Range Status  12/14/2018 115 > OR = 60 mL/min/1.58m2 Final          Passed - Valid encounter within last 6 months    Recent Outpatient Visits           4 months ago Uncontrolled type 2 diabetes mellitus with hyperglycemia, without long-term current use of insulin ([place])   [facility] Olin Hauser, DO   1 year ago Uncontrolled type 2 diabetes mellitus with hyperglycemia, without long-term current use of insulin [facility])   [place], DO   1 year ago Type 2 diabetes mellitus with other specified complication, without long-term current use of insulin [facility])   [place], Devonne Doughty, DO   1 year ago Hyperglycemia   [place], Devonne Doughty, DO   1 year ago [facility] Merrilyn Puma, Jerrel Ivory, NP

## 2020-10-10 ENCOUNTER — Other Ambulatory Visit: Payer: Self-pay | Admitting: Family Medicine

## 2020-10-10 DIAGNOSIS — E1165 Type 2 diabetes mellitus with hyperglycemia: Secondary | ICD-10-CM

## 2020-10-10 NOTE — Telephone Encounter (Signed)
Courtesy refill 30 day supply. Needs appt scheduled . Called patient to schedule appt. No answer, [phone] only # listed, message reports # changed, disconnected or no longer in service. Unable to leave message.

## 2020-10-29 ENCOUNTER — Other Ambulatory Visit: Payer: Self-pay | Admitting: Family Medicine

## 2020-10-29 DIAGNOSIS — E1165 Type 2 diabetes mellitus with hyperglycemia: Secondary | ICD-10-CM

## 2020-10-29 NOTE — Telephone Encounter (Signed)
Courtesy refill. No future visit . No valid encounter with in 6 months. Called patient on [phone] and no answer, recording # changed, no longer in service.  Last labs. 07/19/19

## 2020-11-06 ENCOUNTER — Ambulatory Visit: Payer: Self-pay | Admitting: *Deleted

## 2020-11-06 NOTE — Telephone Encounter (Signed)
Reason for Disposition  Pain or burning with passing urine  Answer Assessment - Initial Assessment Questions 1. COLOR of URINE: "Describe the color of the urine."  (e.g., tea-colored, pink, red, blood clots, bloody)     Unsure- cloudy, clots 2. ONSET: "When did the bleeding start?"      today 3. EPISODES: "How many times has there been blood in the urine?" or "How many times today?"     Cloudy- 2 days- blood today-first time 4. PAIN with URINATION: "Is there any pain with passing your urine?" If Yes, ask: "How bad is the pain?"  (Scale 1-10; or mild, moderate, severe)    - MILD - complains slightly about urination hurting    - MODERATE - interferes with normal activities      - SEVERE - excruciating, unwilling or unable to urinate because of the pain      yes 5. FEVER: "Do you have a fever?" If Yes, ask: "What is your temperature, how was it measured, and when did it start?"     no 6. ASSOCIATED SYMPTOMS: "Are you passing urine more frequently than usual?"     pressure 7. OTHER SYMPTOMS: "Do you have any other symptoms?" (e.g., back/flank pain, abdominal pain, vomiting)     no 8. PREGNANCY: "Is there any chance you are pregnant?" "When was your last menstrual period?"     N/a  Protocols used: Urine - Blood In-A-AH

## 2020-11-06 NOTE — Telephone Encounter (Signed)
Patient calling with symptoms of UTI- cloudy urine, painful urination, pressure, blood in urine today. Patient is requesting treatment- advised will need appointment. Call to office- no appointment available- advised UC. Patient does not want to go there due to expense. Advised patient he needs to go and be evaluated.

## 2020-12-09 ENCOUNTER — Other Ambulatory Visit: Payer: Self-pay | Admitting: Family Medicine

## 2020-12-09 DIAGNOSIS — I10 Essential (primary) hypertension: Secondary | ICD-10-CM

## 2020-12-09 DIAGNOSIS — E1165 Type 2 diabetes mellitus with hyperglycemia: Secondary | ICD-10-CM

## 2020-12-09 NOTE — Telephone Encounter (Signed)
courtesy refills. No future visit , called patient , no answer, left message on voicemail to call clinic back to schedule appt. Last labs 12/14/2018   Requested Prescriptions  Pending Prescriptions Disp Refills  . atenolol-chlorthalidone (TENORETIC) 100-25 MG tablet [Pharmacy Med Name: Atenolol-Chlorthalidone 100-25 MG Oral Tablet] 90 tablet 0    Sig: Take 1 tablet by mouth once daily     Cardiovascular: Beta Blocker + Diuretic Combos Failed - 12/09/2020 10:11 AM      Failed - K in normal range and within 180 days    Potassium  Date Value Ref Range Status  12/14/2018 3.7 3.5 - 5.3 mmol/L Final         Failed - Na in normal range and within 180 days    Sodium  Date Value Ref Range Status  12/14/2018 136 135 - 146 mmol/L Final         Failed - Cr in normal range and within 180 days    Creat  Date Value Ref Range Status  12/14/2018 0.64 0.60 - 1.35 mg/dL Final         Failed - Ca in normal range and within 180 days    Calcium  Date Value Ref Range Status  12/14/2018 9.3 8.6 - 10.3 mg/dL Final         Failed - Valid encounter within last 6 months    Recent Outpatient Visits          8 months ago Uncontrolled type 2 diabetes mellitus with hyperglycemia, without long-term current use of insulin ([place])   [place], DO   1 year ago Uncontrolled type 2 diabetes mellitus with hyperglycemia, without long-term current use of insulin ([place])   [facility] Olin Hauser, DO   1 year ago Type 2 diabetes mellitus with other specified complication, without long-term current use of insulin [facility])   [place], Devonne Doughty, DO   1 year ago Hyperglycemia   [place], Devonne Doughty, DO   2 years ago [place], [place], NP             Passed - Patient is not pregnant      Passed - Last BP in normal range    BP Readings from  Last 1 Encounters:  03/15/20 135/80         Passed - Last Heart Rate in normal range    Pulse Readings from Last 1 Encounters:  03/15/20 82         . metFORMIN (GLUCOPHAGE) 500 MG tablet [Pharmacy Med Name: metFORMIN HCl 500 MG Oral Tablet] 180 tablet 0    Sig: TAKE 1 TABLET BY MOUTH TWICE DAILY WITH A MEAL     Endocrinology:  Diabetes - Biguanides Failed - 12/09/2020 10:11 AM      Failed - Cr in normal range and within 360 days    Creat  Date Value Ref Range Status  12/14/2018 0.64 0.60 - 1.35 mg/dL Final         Failed - HBA1C is between 0 and 7.9 and within 180 days    Hemoglobin A1C  Date Value Ref Range Status  07/19/2019 14.0 (A) 4.0 - 5.6 % Final         Failed - eGFR in normal range and within 360 days    GFR, Est African American  Date Value Ref Range Status  12/14/2018 133 >  OR = 60 mL/min/1.41m2 Final   GFR, Est Non African American  Date Value Ref Range Status  12/14/2018 115 > OR = 60 mL/min/1.11m2 Final         Failed - Valid encounter within last 6 months    Recent Outpatient Visits          8 months ago Uncontrolled type 2 diabetes mellitus with hyperglycemia, without long-term current use of insulin [facility])   [place], DO   1 year ago Uncontrolled type 2 diabetes mellitus with hyperglycemia, without long-term current use of insulin [facility])   [place], DO   1 year ago Type 2 diabetes mellitus with other specified complication, without long-term current use of insulin [facility])   [facility] [place], Devonne Doughty, DO   1 year ago Hyperglycemia   [place], Devonne Doughty, DO   2 years ago [facility] Merrilyn Puma, Jerrel Ivory, NP

## 2021-03-04 ENCOUNTER — Other Ambulatory Visit: Payer: Self-pay | Admitting: Family Medicine

## 2021-03-04 DIAGNOSIS — I10 Essential (primary) hypertension: Secondary | ICD-10-CM

## 2021-03-04 NOTE — Telephone Encounter (Signed)
Requested medication (s) are due for refill today: Due 03/11/21  Requested medication (s) are on the active medication list: yes    Last refill: 12/09/20  #90  0 refills  Future visit scheduled no  Notes to clinic:Failed due to labs, needs appt. Attempted to reach pt, left VM to call back.  Requested Prescriptions  Pending Prescriptions Disp Refills   atenolol-chlorthalidone (TENORETIC) 100-25 MG tablet [Pharmacy Med Name: Atenolol-Chlorthalidone 100-25 MG Oral Tablet] 90 tablet 0    Sig: Take 1 tablet by mouth once daily     Cardiovascular: Beta Blocker + Diuretic Combos Failed - 03/04/2021 12:09 PM      Failed - K in normal range and within 180 days    Potassium  Date Value Ref Range Status  12/14/2018 3.7 3.5 - 5.3 mmol/L Final          Failed - Na in normal range and within 180 days    Sodium  Date Value Ref Range Status  12/14/2018 136 135 - 146 mmol/L Final          Failed - Cr in normal range and within 180 days    Creat  Date Value Ref Range Status  12/14/2018 0.64 0.60 - 1.35 mg/dL Final          Failed - Ca in normal range and within 180 days    Calcium  Date Value Ref Range Status  12/14/2018 9.3 8.6 - 10.3 mg/dL Final          Failed - Valid encounter within last 6 months    Recent Outpatient Visits           11 months ago Uncontrolled type 2 diabetes mellitus with hyperglycemia, without long-term current use of insulin (HCC)   [facility] Smitty Cords, DO   1 year ago Uncontrolled type 2 diabetes mellitus with hyperglycemia, without long-term current use of insulin (HCC)   [facility] Smitty Cords, DO   2 years ago Type 2 diabetes mellitus with other specified complication, without long-term current use of insulin (HCC)   [facility] [place], Netta Neat, DO   2 years ago Hyperglycemia   [facility] [place], Netta Neat, DO   2 years ago Anxiety   [facility] Galen [place], NP              Passed - Patient is not pregnant      Passed - Last BP in normal range    BP Readings from Last 1 Encounters:  03/15/20 135/80          Passed - Last Heart Rate in normal range    Pulse Readings from Last 1 Encounters:  03/15/20 82

## 2021-03-27 ENCOUNTER — Other Ambulatory Visit: Payer: Self-pay | Admitting: Family Medicine

## 2021-03-27 DIAGNOSIS — I10 Essential (primary) hypertension: Secondary | ICD-10-CM

## 2021-03-27 NOTE — Telephone Encounter (Signed)
Requested medications are due for refill today.  yes  Requested medications are on the active medications list.  yes  Last refill. 03/04/2021 #30 /0 refill  Future visit scheduled.   no  Notes to clinic.  Pt last seen 03/15/2020. More than 3 months overdue for OV. Labs are expired.    Requested Prescriptions  Pending Prescriptions Disp Refills   atenolol-chlorthalidone (TENORETIC) 100-25 MG tablet [Pharmacy Med Name: Atenolol-Chlorthalidone 100-25 MG Oral Tablet] 30 tablet 0    Sig: TAKE 1 TABLET BY MOUTH ONCE DAILY. OFFICE VISIT NEEDED FOR FURTHER REFILLS.     Cardiovascular: Beta Blocker + Diuretic Combos Failed - 03/27/2021  5:26 PM      Failed - K in normal range and within 180 days    Potassium  Date Value Ref Range Status  12/14/2018 3.7 3.5 - 5.3 mmol/L Final          Failed - Na in normal range and within 180 days    Sodium  Date Value Ref Range Status  12/14/2018 136 135 - 146 mmol/L Final          Failed - Cr in normal range and within 180 days    Creat  Date Value Ref Range Status  12/14/2018 0.64 0.60 - 1.35 mg/dL Final          Failed - eGFR in normal range and within 180 days    GFR, Est African American  Date Value Ref Range Status  12/14/2018 133 > OR = 60 mL/min/1.25m2 Final   GFR, Est Non African American  Date Value Ref Range Status  12/14/2018 115 > OR = 60 mL/min/1.25m2 Final          Failed - Valid encounter within last 6 months    Recent Outpatient Visits           1 year ago Uncontrolled type 2 diabetes mellitus with hyperglycemia, without long-term current use of insulin ([place])   [facility] Olin Hauser, DO   1 year ago Uncontrolled type 2 diabetes mellitus with hyperglycemia, without long-term current use of insulin [facility])   [facility], Devonne Doughty, DO   2 years ago Type 2 diabetes mellitus with other specified complication, without long-term current use of insulin ([place])   [place], Devonne Doughty, DO   2 years ago Hyperglycemia   [place], Devonne Doughty, DO   2 years ago [facility] Merrilyn Puma, Jerrel Ivory, NP              Passed - Last BP in normal range    BP Readings from Last 1 Encounters:  03/15/20 135/80          Passed - Last Heart Rate in normal range    Pulse Readings from Last 1 Encounters:  03/15/20 82

## 2021-05-05 ENCOUNTER — Other Ambulatory Visit: Payer: Self-pay | Admitting: Family Medicine

## 2021-05-05 DIAGNOSIS — I10 Essential (primary) hypertension: Secondary | ICD-10-CM

## 2021-05-05 NOTE — Telephone Encounter (Signed)
Medication Refill - Medication: atenolol-chlorthalidone (TENORETIC) 100-25 MG tablet ? ?Has the patient contacted their pharmacy? Yes.   ?(But pt was told he needs appt ?Pt has 2 days left ?Preferred Pharmacy (with phone number or street name): [id number] - [place] ([place]), [place] - [address] ?Has the patient been seen for an appointment in the last year OR does the patient have an upcoming appointment? Yes.   ?Pt states he will keep his appt.  But no visits until next week anyway ?Agent: Please be advised that RX refills may take up to 3 business days. We ask that you follow-up with your pharmacy.  ?

## 2021-05-06 ENCOUNTER — Ambulatory Visit: Payer: Self-pay

## 2021-05-06 ENCOUNTER — Other Ambulatory Visit: Payer: Self-pay | Admitting: Family Medicine

## 2021-05-06 DIAGNOSIS — I10 Essential (primary) hypertension: Secondary | ICD-10-CM

## 2021-05-06 NOTE — Telephone Encounter (Signed)
Pt called and spoke with pt's wife, she is stating that he is out of the medication and needing it refilled. She was saying that he gets very sick without it. I advised her that pt is overdue for labs and hasn't had appt within the last 6 months. Even tho pt has appt on 05/15/21 I was unable to refill and I advised her I would see what I needed to do and give pt a call back.  ? ?I called pt back on his number and pt denies having any symptoms of palpitations or nausea or dizziness and states he took his last dose today so doesn't have any for tomorrow. I advised pt I was unable to refill it but would send to Dr. Raliegh Ip as HP and have someone f/up in the morning. Advised pt if he didn't hear from anyone by 1100 to call office back. Pt verbalized understanding.  ? ?Reason for Disposition ? [1] Prescription refill request for ESSENTIAL medicine (i.e., likelihood of harm to patient if not taken) AND [2] triager unable to refill per department policy ? ?Answer Assessment - Initial Assessment Questions ?1. DRUG NAME: "What medicine do you need to have refilled?" ?    Atenolol  ?2. REFILLS REMAINING: "How many refills are remaining?" (Note: The label on the medicine or pill bottle will show how many refills are remaining. If there are no refills remaining, then a renewal may be needed.) ?    0 ?4. PRESCRIBING HCP: "Who prescribed it?" Reason: If prescribed by specialist, call should be referred to that group. ?    Dr. Raliegh Ip  ?5. SYMPTOMS: "Do you have any symptoms?" ?    Wife didn't say specific symptoms. ? ?Protocols used: Medication Refill and Renewal Call-A-AH ? ?

## 2021-05-06 NOTE — Telephone Encounter (Signed)
Pt's partner returned call to report that the patient is completely out and needs this urgently.  ?

## 2021-05-06 NOTE — Telephone Encounter (Signed)
Pts partner was calling in to check on the status of this refill request, please advise  ?

## 2021-05-07 NOTE — Telephone Encounter (Signed)
Please review for Dr. K. ? ?Thanks,  ? ?-Lendy Dittrich  ?

## 2021-05-07 NOTE — Telephone Encounter (Signed)
What medication

## 2021-05-07 NOTE — Addendum Note (Signed)
Addended by: Kavin Leech E on: 05/07/2021 10:10 AM ? ? Modules accepted: Orders ? ?

## 2021-05-07 NOTE — Telephone Encounter (Signed)
Atenolol-chlorthalidone ?

## 2021-05-07 NOTE — Telephone Encounter (Signed)
Already refilled by provider today 05/07/21. ?

## 2021-05-15 ENCOUNTER — Ambulatory Visit: Payer: Medicaid Other | Admitting: Family Medicine

## 2021-05-19 ENCOUNTER — Ambulatory Visit: Payer: Medicaid Other | Admitting: Family Medicine

## 2021-05-19 ENCOUNTER — Other Ambulatory Visit: Payer: Self-pay | Admitting: Family Medicine

## 2021-05-19 DIAGNOSIS — I10 Essential (primary) hypertension: Secondary | ICD-10-CM

## 2021-05-19 MED ORDER — ATENOLOL-CHLORTHALIDONE 100-25 MG PO TABS: 1.0000 | ORAL_TABLET | Freq: Every day | ORAL | 0 refills | Status: DC

## 2021-05-27 ENCOUNTER — Ambulatory Visit: Payer: Medicaid Other | Admitting: Family Medicine

## 2021-05-28 ENCOUNTER — Ambulatory Visit (INDEPENDENT_AMBULATORY_CARE_PROVIDER_SITE_OTHER): Payer: Medicaid Other | Admitting: Family Medicine

## 2021-05-28 VITALS — BP 142/88 | HR 91 | Ht 79.0 in | Wt 300.0 lb

## 2021-05-28 DIAGNOSIS — E1169 Type 2 diabetes mellitus with other specified complication: Secondary | ICD-10-CM

## 2021-05-28 DIAGNOSIS — I10 Essential (primary) hypertension: Secondary | ICD-10-CM | POA: Diagnosis not present

## 2021-05-28 DIAGNOSIS — M172 Bilateral post-traumatic osteoarthritis of knee: Secondary | ICD-10-CM

## 2021-05-28 DIAGNOSIS — E785 Hyperlipidemia, unspecified: Secondary | ICD-10-CM

## 2021-05-28 DIAGNOSIS — F331 Major depressive disorder, recurrent, moderate: Secondary | ICD-10-CM

## 2021-05-28 DIAGNOSIS — E1165 Type 2 diabetes mellitus with hyperglycemia: Secondary | ICD-10-CM | POA: Diagnosis not present

## 2021-05-28 DIAGNOSIS — E669 Obesity, unspecified: Secondary | ICD-10-CM | POA: Diagnosis not present

## 2021-05-28 DIAGNOSIS — F419 Anxiety disorder, unspecified: Secondary | ICD-10-CM

## 2021-05-28 MED ORDER — TRULICITY 1.5 MG/0.5ML ~~LOC~~ SOAJ: 1.5000 mg | SUBCUTANEOUS | 3 refills | Status: DC

## 2021-05-28 MED ORDER — BUSPIRONE HCL 10 MG PO TABS: 10.0000 mg | ORAL_TABLET | Freq: Two times a day (BID) | ORAL | 2 refills | Status: DC | PRN

## 2021-05-28 MED ORDER — DULOXETINE HCL 30 MG PO CPEP: 30.0000 mg | ORAL_CAPSULE | Freq: Every day | ORAL | 2 refills | Status: DC

## 2021-05-28 MED ORDER — METFORMIN HCL 500 MG PO TABS: 1000.0000 mg | ORAL_TABLET | Freq: Two times a day (BID) | ORAL | 3 refills | Status: DC

## 2021-05-28 MED ORDER — ATENOLOL-CHLORTHALIDONE 100-25 MG PO TABS: 1.0000 | ORAL_TABLET | Freq: Every day | ORAL | 3 refills | Status: DC

## 2021-05-28 MED ORDER — MELOXICAM 15 MG PO TABS: 15.0000 mg | ORAL_TABLET | Freq: Every day | ORAL | 4 refills | Status: DC | PRN

## 2021-05-28 NOTE — Assessment & Plan Note (Signed)
Discussion today on underlying mood and anxiety ?Will initiate SNRI Duloxetine 30mg  daily for mood/anxiety/pain, dose adjust in future as indicated. ?Add back on restart Buspar 10mg  BID PRN, anxiety ?Future consider Trazodone for insomnia ?Offer therapist/referral if needed ?

## 2021-05-28 NOTE — Patient Instructions (Addendum)
Thank you for coming to the office today. ? ?Duloxetine 30mg  daily for mood, anxiety, and also helps chronic pain. Can adjust dose future if need ? ?Add Buspar 10mg  as needed for anxiety, can skip if don't need. ? ?Future consider Trazodone for sleep. ? ?Re ordered other meds ? ?Increased Trulicity from 0.75 up to 1.5 ? ?DUE for FASTING BLOOD WORK (no food or drink after midnight before the lab appointment, only water or coffee without cream/sugar on the morning of) ? ?SCHEDULE "Lab Only" visit in the morning at the clinic for lab draw in 1 day ? ?- Make sure Lab Only appointment is at about 1 week before your next appointment, so that results will be available ? ?For Lab Results, once available within 2-3 days of blood draw, you can can log in to MyChart online to view your results and a brief explanation. Also, we can discuss results at next follow-up visit. ? ? ?Please schedule a Follow-up Appointment to: Return in about 3 months (around 08/27/2021) for Return tomorrow for lab 8am then 3 month follow-up DM A1c, Weight, Anxiety/Mood. ? ?If you have any other questions or concerns, please feel free to call the office or send a message through MyChart. You may also schedule an earlier appointment if necessary. ? ?Additionally, you may be receiving a survey about your experience at our office within a few days to 1 week by e-mail or mail. We value your feedback. ? ? , DO ?[facility], Saralyn Pilar ?

## 2021-05-28 NOTE — Progress Notes (Signed)
? ?Subjective:  ? ? Patient ID: Steve Welch, male    DOB: November 15, 1970, 51 y.o.   MRN: [id number] ? ?Steve Welch is a 51 y.o. male presenting on 05/28/2021 for Diabetes and Hypertension ? ? ?HPI ? ?CHRONIC DM, Type 2 ?HTN ?Obesity BMI >33 ?Weight maintained 300 lbs ?He has dramatically limited carbs, starches, sugars in diet ?Diet - limited most carbs / starches, no sodas sweet tea ?Meds - Taking Trulicity 0.75mg  weekly inj, Metformin 500mg  BID was taking max 1000mg  BID ?Admits polyuria ?Currently not on ACEi / ARB ?Lifestyle: ?- Exercise (improving but limited by back knees - wheelchair bound ?Denies hypoglycemia, polyuria, visual changes, numbness or tingling. ?  ?Major Depression in partial remission / Anxiety ? ?Overwhelmed by stressors and fallout from when his father passed. ?He is working on trying to find work. His wife currently only one making money, he feels overwhelmed and stressed. Discussed family stress. ?Approved medically for disability but disqualified because wife makes too much money ?Admits irritability and poor sleep insomnia ?  ?Knee Arthritis pain ? ? ? ?  05/28/2021  ?  9:40 AM 07/18/2019  ?  1:55 PM 12/15/2018  ? 11:17 AM  ?Depression screen PHQ 2/9  ?Decreased Interest 2 2 0  ?Down, Depressed, Hopeless 1 2 1   ?PHQ - 2 Score 3 4 1   ?Altered sleeping 1 3 2   ?Tired, decreased energy 0 1 0  ?Change in appetite 1 1 0  ?Feeling bad or failure about yourself  2 3 2   ?Trouble concentrating 2 0 0  ?Moving slowly or fidgety/restless 0 0 0  ?Suicidal thoughts 0 0 0  ?PHQ-9 Score 9 12 5   ?Difficult doing work/chores Not difficult at all Somewhat difficult Not difficult at all  ? ? ?  05/28/2021  ?  9:43 AM 07/18/2019  ?  1:55 PM 12/15/2018  ? 11:20 AM 09/09/2018  ? 10:46 AM  ?GAD 7 : Generalized Anxiety Score  ?Nervous, Anxious, on Edge 0 0 0 2  ?Control/stop worrying 1 3 1 2   ?Worry too much - different things 1 1 2 2   ?Trouble relaxing 1 1 0 2  ?Restless 0 0 0 2  ?Easily annoyed or irritable 1 1 1 1    ?Afraid - awful might happen 0 1 1 1   ?Total GAD 7 Score 4 7 5 12   ?Anxiety Difficulty Somewhat difficult Somewhat difficult Somewhat difficult Not difficult at all  ? ? ? ? ?Social History  ? ?Tobacco Use  ? Smoking status: Never  ? Smokeless tobacco: Current  ?  Types: Snuff  ? Tobacco comments:  ?  started 18 x 2 years, 2010 1 can every 3 days  ?Vaping Use  ? Vaping Use: Never used  ?Substance Use Topics  ? Alcohol use: No  ? Drug use: No  ? ? ?Review of Systems ?Per HPI unless specifically indicated above ? ?   ?Objective:  ?  ?BP (!) 142/88 (BP Location: Left Arm, Cuff Size: Normal)   Pulse 91   Ht 6\' 7"  (2.007 m)   Wt 300 lb (136.1 kg)   SpO2 98%   BMI 33.80 kg/m?   ?Wt Readings from Last 3 Encounters:  ?05/28/21 300 lb (136.1 kg)  ?03/15/20 300 lb (136.1 kg)  ?07/18/19 (!) 345 lb (156.5 kg)  ?  ?Physical Exam ?Vitals and nursing note reviewed.  ?Constitutional:   ?   General: He is not in acute distress. ?   Appearance: He is well-developed. He is  obese. He is not diaphoretic.  ?   Comments: Well-appearing, comfortable, cooperative  ?HENT:  ?   Head: Normocephalic and atraumatic.  ?Eyes:  ?   General:     ?   Right eye: No discharge.     ?   Left eye: No discharge.  ?   Conjunctiva/sclera: Conjunctivae normal.  ?Neck:  ?   Thyroid: No thyromegaly.  ?Cardiovascular:  ?   Rate and Rhythm: Normal rate and regular rhythm.  ?   Pulses: Normal pulses.  ?   Heart sounds: Normal heart sounds. No murmur heard. ?Pulmonary:  ?   Effort: Pulmonary effort is normal. No respiratory distress.  ?   Breath sounds: Normal breath sounds. No wheezing or rales.  ?Musculoskeletal:     ?   General: Normal range of motion.  ?   Cervical back: Normal range of motion and neck supple.  ?   Comments: In wheelchair  ?Lymphadenopathy:  ?   Cervical: No cervical adenopathy.  ?Skin: ?   General: Skin is warm and dry.  ?   Findings: No erythema or rash.  ?Neurological:  ?   Mental Status: He is alert and oriented to person, place, and  time. Mental status is at baseline.  ?Psychiatric:     ?   Behavior: Behavior normal.  ?   Comments: Well groomed, good eye contact, normal speech and thoughts  ? ? ? ?Results for orders placed or performed in visit on 07/27/19  ?HM DIABETES EYE EXAM  ?Result Value Ref Range  ? [facility] Diabetic Eye Exam No Retinopathy No Retinopathy  ? ?   ?Assessment & Plan:  ? ?Problem List Items Addressed This Visit   ? ? Uncontrolled type 2 diabetes mellitus with hyperglycemia, without long-term current use of insulin (HCC) - Primary  ?  Due for A1c, will return for lab draw, fasting ?Seems improving control based on CBG readings ?Complications - hyperglycemia obesity, GERD ? ?Plan:  ?1. Increase Trulicity from 0.75 up to 1.5mg  weekly inj ?- Adjust dose Metformin up to 1000mg  BID ?2. Encourage improved lifestyle - low carb, low sugar diet, reduce portion size, continue improving start regular exercise ?3. Check CBG, bring log to next visit for review ?  ?  ? Relevant Medications  ? atenolol-chlorthalidone (TENORETIC) 100-25 MG tablet  ? metFORMIN (GLUCOPHAGE) 500 MG tablet  ? TRULICITY 1.5 MG/0.5ML SOPN  ? Other Relevant Orders  ? COMPLETE METABOLIC PANEL WITH GFR  ? CBC with Differential/Platelet  ? Hemoglobin A1c  ? Osteoarthritis of both knees  ? Relevant Medications  ? meloxicam (MOBIC) 15 MG tablet  ? Obesity (BMI 30.0-34.9)  ? Relevant Orders  ? COMPLETE METABOLIC PANEL WITH GFR  ? CBC with Differential/Platelet  ? Lipid panel  ? Hemoglobin A1c  ? TSH  ? Moderate recurrent major depression (HCC)  ?  Recent worsening flare with moderate active depression ?See A&P already under anxiety ?  ?  ? Relevant Medications  ? DULoxetine (CYMBALTA) 30 MG capsule  ? busPIRone (BUSPAR) 10 MG tablet  ? Hyperlipidemia associated with type 2 diabetes mellitus (HCC)  ?  Lipid panel fasting lab this week ?Consider statin therapy ?  ?  ? Relevant Medications  ? atenolol-chlorthalidone (TENORETIC) 100-25 MG tablet  ? metFORMIN (GLUCOPHAGE) 500 MG  tablet  ? TRULICITY 1.5 MG/0.5ML SOPN  ? Other Relevant Orders  ? COMPLETE METABOLIC PANEL WITH GFR  ? Lipid panel  ? TSH  ? Essential hypertension  ?  Mild elevated BP, re order medication ?Obesity ?  ?Plan:  ?1. Continue current BP regimen Atenolol-Chlorthalidone 100-25mg  daily ?- Consider other adjustments to BP regimen in future if indicated ?2. Encourage improved lifestyle - low sodium diet, regular exercise ?3. Start monitor BP outside office, bring readings to next visit, if persistently >140/90 or new symptoms notify office sooner ?  ?  ? Relevant Medications  ? atenolol-chlorthalidone (TENORETIC) 100-25 MG tablet  ? Anxiety  ?  Discussion today on underlying mood and anxiety ?Will initiate SNRI Duloxetine 30mg  daily for mood/anxiety/pain, dose adjust in future as indicated. ?Add back on restart Buspar 10mg  BID PRN, anxiety ?Future consider Trazodone for insomnia ?Offer therapist/referral if needed ?  ?  ? Relevant Medications  ? DULoxetine (CYMBALTA) 30 MG capsule  ? busPIRone (BUSPAR) 10 MG tablet  ? ?  ?Orders Placed This Encounter  ?Procedures  ? COMPLETE METABOLIC PANEL WITH GFR  ?  Standing Status:   Future  ?  Standing Expiration Date:   07/17/2021  ? CBC with Differential/Platelet  ?  Standing Status:   Future  ?  Standing Expiration Date:   07/17/2021  ? Lipid panel  ?  Standing Status:   Future  ?  Standing Expiration Date:   07/17/2021  ?  Order Specific Question:   Has the patient fasted?  ?  Answer:   Yes  ? Hemoglobin A1c  ?  Standing Status:   Future  ?  Standing Expiration Date:   07/17/2021  ? TSH  ?  Standing Status:   Future  ?  Standing Expiration Date:   07/17/2021  ? ? ? ? ?Meds ordered this encounter  ?Medications  ? atenolol-chlorthalidone (TENORETIC) 100-25 MG tablet  ?  Sig: Take 1 tablet by mouth daily.  ?  Dispense:  90 tablet  ?  Refill:  3  ? meloxicam (MOBIC) 15 MG tablet  ?  Sig: Take 1 tablet (15 mg total) by mouth daily as needed for pain.  ?  Dispense:  30 tablet  ?  Refill:  4  ?  metFORMIN (GLUCOPHAGE) 500 MG tablet  ?  Sig: Take 2 tablets (1,000 mg total) by mouth 2 (two) times daily with a meal.  ?  Dispense:  360 tablet  ?  Refill:  3  ? TRULICITY 1.5 MG/0.5ML SOPN  ?  Sig: Inject 1.5

## 2021-05-28 NOTE — Assessment & Plan Note (Signed)
Lipid panel fasting lab this week ?Consider statin therapy ?

## 2021-05-28 NOTE — Assessment & Plan Note (Signed)
Due for A1c, will return for lab draw, fasting ?Seems improving control based on CBG readings ?Complications - hyperglycemia obesity, GERD ? ?Plan:  ?1. Increase Trulicity from 0.75 up to 1.5mg  weekly inj ?- Adjust dose Metformin up to 1000mg  BID ?2. Encourage improved lifestyle - low carb, low sugar diet, reduce portion size, continue improving start regular exercise ?3. Check CBG, bring log to next visit for review ?

## 2021-05-28 NOTE — Assessment & Plan Note (Signed)
Mild elevated BP, re order medication ?Obesity ?  ?Plan:  ?1. Continue current BP regimen Atenolol-Chlorthalidone 100-25mg  daily ?- Consider other adjustments to BP regimen in future if indicated ?2. Encourage improved lifestyle - low sodium diet, regular exercise ?3. Start monitor BP outside office, bring readings to next visit, if persistently >140/90 or new symptoms notify office sooner ?

## 2021-05-28 NOTE — Assessment & Plan Note (Signed)
Recent worsening flare with moderate active depression See A&P already under anxiety 

## 2021-05-29 ENCOUNTER — Other Ambulatory Visit: Payer: Medicaid Other

## 2021-05-29 DIAGNOSIS — E1165 Type 2 diabetes mellitus with hyperglycemia: Secondary | ICD-10-CM

## 2021-05-29 DIAGNOSIS — E669 Obesity, unspecified: Secondary | ICD-10-CM

## 2021-05-29 DIAGNOSIS — E1169 Type 2 diabetes mellitus with other specified complication: Secondary | ICD-10-CM

## 2021-09-01 ENCOUNTER — Ambulatory Visit: Payer: Medicaid Other | Admitting: Family Medicine

## 2021-09-05 ENCOUNTER — Ambulatory Visit: Payer: Medicaid Other | Admitting: Family Medicine

## 2021-09-09 ENCOUNTER — Encounter: Payer: Self-pay | Admitting: Family Medicine

## 2021-09-09 ENCOUNTER — Ambulatory Visit (INDEPENDENT_AMBULATORY_CARE_PROVIDER_SITE_OTHER): Payer: Medicaid Other | Admitting: Family Medicine

## 2021-09-09 VITALS — BP 135/80 | HR 95 | Ht 79.0 in | Wt 320.0 lb

## 2021-09-09 DIAGNOSIS — I10 Essential (primary) hypertension: Secondary | ICD-10-CM

## 2021-09-09 DIAGNOSIS — E1165 Type 2 diabetes mellitus with hyperglycemia: Secondary | ICD-10-CM

## 2021-09-09 DIAGNOSIS — F331 Major depressive disorder, recurrent, moderate: Secondary | ICD-10-CM | POA: Diagnosis not present

## 2021-09-09 DIAGNOSIS — F419 Anxiety disorder, unspecified: Secondary | ICD-10-CM

## 2021-09-09 LAB — POCT GLYCOSYLATED HEMOGLOBIN (HGB A1C): Hemoglobin A1C: 9.1 % — AB (ref 4.0–5.6)

## 2021-09-09 NOTE — Assessment & Plan Note (Signed)
Recent worsening flare with moderate active depression See A&P already under anxiety

## 2021-09-09 NOTE — Assessment & Plan Note (Signed)
Improving A1c at 9.1 prior >14, now back on track with meds, improving but still not at goal Seems improving control based on CBG readings Complications - hyperglycemia obesity, GERD  Plan:  1. Continue Trulicity 1.5mg  weekly inj and Metformin 1000 BID - some mild intolerance on trulicity future consider dose inc if need 2. Encourage improved lifestyle - low carb, low sugar diet, reduce portion size, continue improving start regular exercise 3. Check CBG, bring log to next visit for review

## 2021-09-09 NOTE — Assessment & Plan Note (Signed)
Discussion today on underlying mood and anxiety Continue SNRI Duloxetine 30mg  daily for mood/anxiety/pain, dose adjust in future as indicated. On Buspar 10mg  BID PRN, anxiety Improving  Future consider Trazodone for insomnia Offer therapist/referral if needed

## 2021-09-09 NOTE — Progress Notes (Signed)
Subjective:    Patient ID: Steve Welch, male    DOB: 1970-09-18, 51 y.o.   MRN: [id number]  Steve Welch is a 51 y.o. male presenting on 09/09/2021 for Diabetes and Anxiety   HPI  CHRONIC DM, Type 2 HTN Obesity BMI >36 Weight gained to 320 lbs He has dramatically limited carbs, starches, sugars in diet Diet - avoiding bread and carbs. No sweet tea or sodas - Admits still consuming milk regularly Meds - Taking Trulicity 1.5 mg weekly inj, Metformin 1000 BID Admits polyuria Currently not on ACEi / ARB Lifestyle: - Exercise (improving but limited by back knees - wheelchair bound Denies hypoglycemia, polyuria, visual changes, numbness or tingling.  Today 2 year anniversary of father passing. He says never was able to grieve. Today is a stressful day. He has lost several family members and on certain days has difficult time dealing with stressors.  Avg home BP 130-135 / 80-85    Major Depression in partial remission / Anxiety  He is doing better coping and on meds. He is now Painting more for stress relief        09/09/2021    3:12 PM 05/28/2021    9:40 AM 07/18/2019    1:55 PM  Depression screen PHQ 2/9  Decreased Interest 1 2 2   Down, Depressed, Hopeless 2 1 2   PHQ - 2 Score 3 3 4   Altered sleeping 3 1 3   Tired, decreased energy 1 0 1  Change in appetite 3 1 1   Feeling bad or failure about yourself  3 2 3   Trouble concentrating 0 2 0  Moving slowly or fidgety/restless 3 0 0  Suicidal thoughts 0 0 0  PHQ-9 Score 16 9 12   Difficult doing work/chores Very difficult Not difficult at all Somewhat difficult      09/09/2021    3:13 PM 05/28/2021    9:43 AM 07/18/2019    1:55 PM 12/15/2018   11:20 AM  GAD 7 : Generalized Anxiety Score  Nervous, Anxious, on Edge 1 0 0 0  Control/stop worrying 3 1 3 1   Worry too much - different things 3 1 1 2   Trouble relaxing 1 1 1  0  Restless  0 0 0  Easily annoyed or irritable 3 1 1 1   Afraid - awful might happen 0 0 1 1   Total GAD 7 Score  4 7 5   Anxiety Difficulty Somewhat difficult Somewhat difficult Somewhat difficult Somewhat difficult      Social History   Tobacco Use   Smoking status: Never   Smokeless tobacco: Current    Types: Snuff   Tobacco comments:    started 18 x 2 years, 2010 1 can every 3 days  Vaping Use   Vaping Use: Never used  Substance Use Topics   Alcohol use: No   Drug use: No    Review of Systems Per HPI unless specifically indicated above     Objective:    BP 135/80 (BP Location: Left Arm, Cuff Size: Normal)   Pulse 95   Ht 6\' 7"  (2.007 m)   Wt (!) 320 lb (145.2 kg)   SpO2 99%   BMI 36.05 kg/m   Wt Readings from Last 3 Encounters:  09/09/21 (!) 320 lb (145.2 kg)  05/28/21 300 lb (136.1 kg)  03/15/20 300 lb (136.1 kg)    Physical Exam Vitals and nursing note reviewed.  Constitutional:      General: He is not in acute distress.  Appearance: Normal appearance. He is well-developed. He is obese. He is not diaphoretic.     Comments: Well-appearing, comfortable, cooperative  HENT:     Head: Normocephalic and atraumatic.  Eyes:     General:        Right eye: No discharge.        Left eye: No discharge.     Conjunctiva/sclera: Conjunctivae normal.  Cardiovascular:     Rate and Rhythm: Normal rate.  Pulmonary:     Effort: Pulmonary effort is normal.  Skin:    General: Skin is warm and dry.     Findings: No erythema or rash.  Neurological:     Mental Status: He is alert and oriented to person, place, and time.  Psychiatric:        Mood and Affect: Mood normal.        Behavior: Behavior normal.        Thought Content: Thought content normal.     Comments: Well groomed, good eye contact, normal speech and thoughts      Diabetic Foot Exam - Simple   Simple Foot Form Diabetic Foot exam was performed with the following findings: Yes 09/09/2021  3:40 PM  Visual Inspection No deformities, no ulcerations, no other skin breakdown bilaterally:  Yes Sensation Testing Intact to touch and monofilament testing bilaterally: Yes Pulse Check Posterior Tibialis and Dorsalis pulse intact bilaterally: Yes Comments    Recent Labs    09/09/21 1535  HGBA1C 9.1*     Results for orders placed or performed in visit on 09/09/21  POCT HgB A1C  Result Value Ref Range   Hemoglobin A1C 9.1 (A) 4.0 - 5.6 %      Assessment & Plan:   Problem List Items Addressed This Visit     Anxiety    Discussion today on underlying mood and anxiety Continue SNRI Duloxetine 30mg  daily for mood/anxiety/pain, dose adjust in future as indicated. On Buspar 10mg  BID PRN, anxiety Improving  Future consider Trazodone for insomnia Offer therapist/referral if needed      Essential hypertension    Mild elevated BP, stressors today, home readings improved Obesity   Plan:  1. Continue current BP regimen Atenolol-Chlorthalidone 100-25mg  daily - Consider other adjustments to BP regimen in future if indicated 2. Encourage improved lifestyle - low sodium diet, regular exercise 3. Cotninue monitor BP outside office, bring readings to next visit, if persistently >140/90 or new symptoms notify office sooner      Moderate recurrent major depression (HCC)    Recent worsening flare with moderate active depression See A&P already under anxiety      Morbid obesity (HCC)   Uncontrolled type 2 diabetes mellitus with hyperglycemia, without long-term current use of insulin (HCC) - Primary    Improving A1c at 9.1 prior >14, now back on track with meds, improving but still not at goal Seems improving control based on CBG readings Complications - hyperglycemia obesity, GERD  Plan:  1. Continue Trulicity 1.5mg  weekly inj and Metformin 1000 BID - some mild intolerance on trulicity future consider dose inc if need 2. Encourage improved lifestyle - low carb, low sugar diet, reduce portion size, continue improving start regular exercise 3. Check CBG, bring log to next  visit for review      Relevant Orders   POCT HgB A1C (Completed)      No orders of the defined types were placed in this encounter.    Follow up plan: Return in about 3 months (around 12/10/2021)  for 3 month follow-up DM A1c, HTN, Weight, Urine microalbumin.    Saralyn Pilar, DO [facility] 09/09/2021, 3:30 PM

## 2021-09-09 NOTE — Assessment & Plan Note (Signed)
Mild elevated BP, stressors today, home readings improved Obesity   Plan:  1. Continue current BP regimen Atenolol-Chlorthalidone 100-25mg  daily - Consider other adjustments to BP regimen in future if indicated 2. Encourage improved lifestyle - low sodium diet, regular exercise 3. Cotninue monitor BP outside office, bring readings to next visit, if persistently >140/90 or new symptoms notify office sooner

## 2021-09-09 NOTE — Patient Instructions (Addendum)
Thank you for coming to the office today.  Recent Labs    09/09/21 1535  HGBA1C 9.1*    Keep on current therapy with Trulicity 1.5mg  weekly, your side effects seem to be related 1-2 days after the shot. We can adjust dose in future.  Try to limit the sugars / starches sweets in diet.  Send [place] the copy of the Diabetic Eye Report.  Fax to our # here at [phone]   Future referral to Dermatology [facility]   Colon Cancer Screening: - For all adults age 67+ routine colon cancer screening is highly recommended.     - Recent guidelines from [employer] Society recommend starting age of 42 - Early detection of colon cancer is important, because often there are no warning signs or symptoms, also if found early usually it can be cured. Late stage is hard to treat.  - If you are not interested in Colonoscopy screening (if done and normal you could be cleared for 5 to 10 years until next due), then Cologuard is an excellent alternative for screening test for Colon Cancer. It is highly sensitive for detecting DNA of colon cancer from even the earliest stages. Also, there is NO bowel prep required. - If Cologuard is NEGATIVE, then it is good for 3 years before next due - If Cologuard is POSITIVE, then it is strongly advised to get a Colonoscopy, which allows the GI doctor to locate the source of the cancer or polyp (even very early stage) and treat it by removing it. -------------------------    Please schedule a Follow-up Appointment to: Return in about 3 months (around 12/10/2021) for 3 month follow-up DM A1c, HTN, Weight, Urine microalbumin.  If you have any other questions or concerns, please feel free to call the office or send a message through MyChart. You may also schedule an earlier appointment if necessary.  Additionally, you may be receiving a survey about your experience at our office within a few days to 1 week by e-mail or mail. We value your  feedback.  Saralyn Pilar, DO [facility], [place]

## 2021-12-11 ENCOUNTER — Ambulatory Visit: Payer: Medicaid Other | Admitting: Family Medicine

## 2021-12-18 ENCOUNTER — Ambulatory Visit: Payer: Medicaid Other | Admitting: Family Medicine

## 2022-01-19 ENCOUNTER — Ambulatory Visit: Payer: Self-pay | Admitting: Family Medicine

## 2022-01-30 ENCOUNTER — Telehealth: Payer: Self-pay | Admitting: Family Medicine

## 2022-01-30 NOTE — Telephone Encounter (Signed)
While on the phone with patient scheduling his son's AWV- patient asked that I send a message to clinical staff asking what OTC medication he could take for a head cold that would not interact with his regular medications.  I advised patient I would send this message and someone clinical from the office would call him.

## 2022-06-03 ENCOUNTER — Other Ambulatory Visit: Payer: Self-pay | Admitting: Internal Medicine

## 2022-06-03 DIAGNOSIS — I10 Essential (primary) hypertension: Secondary | ICD-10-CM

## 2022-06-04 NOTE — Telephone Encounter (Signed)
Requested medication (s) are due for refill today: yes  Requested medication (s) are on the active medication list: yes  Last refill:  05/28/21 #90/3   Future visit scheduled: no  Notes to clinic:  rx expired, Unable to refill per protocol due to failed labs, no updated results.      Requested Prescriptions  Pending Prescriptions Disp Refills   atenolol-chlorthalidone (TENORETIC) 100-25 MG tablet [Pharmacy Med Name: Atenolol-Chlorthalidone 100-25 MG Oral Tablet] 10 tablet 0    Sig: TAKE 1 TABLET BY MOUTH ONCE DAILY . APPOINTMENT REQUIRED FOR FUTURE REFILLS     Cardiovascular: Beta Blocker + Diuretic Combos Failed - 06/03/2022  5:31 PM      Failed - K in normal range and within 180 days    Potassium  Date Value Ref Range Status  12/14/2018 3.7 3.5 - 5.3 mmol/L Final         Failed - Na in normal range and within 180 days    Sodium  Date Value Ref Range Status  12/14/2018 136 135 - 146 mmol/L Final         Failed - Cr in normal range and within 180 days    Creat  Date Value Ref Range Status  12/14/2018 0.64 0.60 - 1.35 mg/dL Final         Failed - eGFR in normal range and within 180 days    GFR, Est African American  Date Value Ref Range Status  12/14/2018 133 > OR = 60 mL/min/1.18m2 Final   GFR, Est Non African American  Date Value Ref Range Status  12/14/2018 115 > OR = 60 mL/min/1.46m2 Final         Failed - Valid encounter within last 6 months    Recent Outpatient Visits           8 months ago Uncontrolled type 2 diabetes mellitus with hyperglycemia, without long-term current use of insulin [facility])   [place] [facility] Smitty Cords, DO   1 year ago Uncontrolled type 2 diabetes mellitus with hyperglycemia, without long-term current use of insulin [facility])   [place] [facility] [place], Netta Neat, DO   2 years ago Uncontrolled type 2 diabetes mellitus with hyperglycemia, without long-term current use of insulin  [facility])   [place] [facility] [place], Netta Neat, DO   2 years ago Uncontrolled type 2 diabetes mellitus with hyperglycemia, without long-term current use of insulin [facility])   [place] [facility] [place], Netta Neat, DO   3 years ago Type 2 diabetes mellitus with other specified complication, without long-term current use of insulin [facility])   [place] [facility] [place], Netta Neat, DO              Passed - Last BP in normal range    BP Readings from Last 1 Encounters:  09/09/21 135/80         Passed - Last Heart Rate in normal range    Pulse Readings from Last 1 Encounters:  09/09/21 95

## 2022-06-07 ENCOUNTER — Other Ambulatory Visit: Payer: Self-pay | Admitting: Family Medicine

## 2022-06-07 DIAGNOSIS — E1165 Type 2 diabetes mellitus with hyperglycemia: Secondary | ICD-10-CM

## 2022-06-07 DIAGNOSIS — I10 Essential (primary) hypertension: Secondary | ICD-10-CM

## 2022-06-08 NOTE — Telephone Encounter (Signed)
Call to patient- he is driving car now and will call back for appointment next month. Patient's wife has new diagnosis cancer- facet joint cancer and he has been occupied with that. Patient states BP average- 135/80 and glucose average is 120. Patient advised I can call in courtesy RF- but don't forget to take care of himself- he will call back for appointment. Requested Prescriptions  Pending Prescriptions Disp Refills   atenolol-chlorthalidone (TENORETIC) 100-25 MG tablet [Pharmacy Med Name: Atenolol-Chlorthalidone 100-25 MG Oral Tablet] 90 tablet 0    Sig: Take 1 tablet by mouth once daily     Cardiovascular: Beta Blocker + Diuretic Combos Failed - 06/07/2022  4:38 PM      Failed - K in normal range and within 180 days    Potassium  Date Value Ref Range Status  12/14/2018 3.7 3.5 - 5.3 mmol/L Final         Failed - Na in normal range and within 180 days    Sodium  Date Value Ref Range Status  12/14/2018 136 135 - 146 mmol/L Final         Failed - Cr in normal range and within 180 days    Creat  Date Value Ref Range Status  12/14/2018 0.64 0.60 - 1.35 mg/dL Final         Failed - eGFR in normal range and within 180 days    GFR, Est African American  Date Value Ref Range Status  12/14/2018 133 > OR = 60 mL/min/1.50m2 Final   GFR, Est Non African American  Date Value Ref Range Status  12/14/2018 115 > OR = 60 mL/min/1.70m2 Final         Failed - Valid encounter within last 6 months    Recent Outpatient Visits           9 months ago Uncontrolled type 2 diabetes mellitus with hyperglycemia, without long-term current use of insulin [facility])   [place] [facility] Smitty Cords, DO   1 year ago Uncontrolled type 2 diabetes mellitus with hyperglycemia, without long-term current use of insulin [facility])   [place] [facility] [place], Netta Neat, DO   2 years ago Uncontrolled type 2 diabetes mellitus with hyperglycemia, without  long-term current use of insulin [facility])   [place] [facility] [place], Netta Neat, DO   2 years ago Uncontrolled type 2 diabetes mellitus with hyperglycemia, without long-term current use of insulin [facility])   [place] [facility] [place], Netta Neat, DO   3 years ago Type 2 diabetes mellitus with other specified complication, without long-term current use of insulin [facility])   [place] [facility] [place], Netta Neat, DO              Passed - Last BP in normal range    BP Readings from Last 1 Encounters:  09/09/21 135/80         Passed - Last Heart Rate in normal range    Pulse Readings from Last 1 Encounters:  09/09/21 95          metFORMIN (GLUCOPHAGE) 500 MG tablet [Pharmacy Med Name: metFORMIN HCl 500 MG Oral Tablet] 360 tablet 0    Sig: TAKE 2 TABLETS BY MOUTH TWICE DAILY WITH MEALS     Endocrinology:  Diabetes - Biguanides Failed - 06/07/2022  4:38 PM      Failed - Cr in normal range and within 360 days  Creat  Date Value Ref Range Status  12/14/2018 0.64 0.60 - 1.35 mg/dL Final         Failed - HBA1C is between 0 and 7.9 and within 180 days    Hemoglobin A1C  Date Value Ref Range Status  09/09/2021 9.1 (A) 4.0 - 5.6 % Final         Failed - eGFR in normal range and within 360 days    GFR, Est African American  Date Value Ref Range Status  12/14/2018 133 > OR = 60 mL/min/1.56m2 Final   GFR, Est Non African American  Date Value Ref Range Status  12/14/2018 115 > OR = 60 mL/min/1.78m2 Final         Failed - B12 Level in normal range and within 720 days    No results found for: "VITAMINB12"       Failed - Valid encounter within last 6 months    Recent Outpatient Visits           9 months ago Uncontrolled type 2 diabetes mellitus with hyperglycemia, without long-term current use of insulin (HCC)   [place] [facility] [place], Netta Neat, DO   1 year ago  Uncontrolled type 2 diabetes mellitus with hyperglycemia, without long-term current use of insulin (HCC)   [place] [facility] [place], Netta Neat, DO   2 years ago Uncontrolled type 2 diabetes mellitus with hyperglycemia, without long-term current use of insulin (HCC)   [place] [facility] [place], Netta Neat, DO   2 years ago Uncontrolled type 2 diabetes mellitus with hyperglycemia, without long-term current use of insulin (HCC)   [place] [facility] [place], Netta Neat, DO   3 years ago Type 2 diabetes mellitus with other specified complication, without long-term current use of insulin (HCC)   [place] [facility] [place], Alexander J, DO              Failed - CBC within normal limits and completed in the last 12 months    No results found for: "WBC", "WBCKUC" No results found for: "RBC", "RBCKUC" No results found for: "HGB", "HGBKUC", "HGBPOCKUC", "HGBOTHER", "TOTHGB", "HGBPLASMA", "LABHEMOF" No results found for: "HCT", "HCTKUC", "SRHCT" No results found for: "MCHC", "MCHCKUC" No results found for: "MCH", "MCHKUC" No results found for: "MCVKUC", "MCV" No results found for: "PLTCOUNTKUC", "LABPLAT", "POCPLA" No results found for: "RDW", "RDWKUC", "POCRDW"

## 2022-07-09 ENCOUNTER — Other Ambulatory Visit: Payer: Self-pay | Admitting: Family Medicine

## 2022-07-09 DIAGNOSIS — I10 Essential (primary) hypertension: Secondary | ICD-10-CM

## 2022-07-09 NOTE — Telephone Encounter (Signed)
Requested medications are due for refill today.  yes  Requested medications are on the active medications list.  yes  Last refill. 06/08/2022 #30 0 rf  Future visit scheduled.   no  Notes to clinic.  Pt is more than 3 months over due for an office visit. Labs are expired.    Requested Prescriptions  Pending Prescriptions Disp Refills   atenolol-chlorthalidone (TENORETIC) 100-25 MG tablet [Pharmacy Med Name: Atenolol-Chlorthalidone 100-25 MG Oral Tablet] 30 tablet 0    Sig: Take 1 tablet by mouth once daily     Cardiovascular: Beta Blocker + Diuretic Combos Failed - 07/09/2022  5:14 PM      Failed - K in normal range and within 180 days    Potassium  Date Value Ref Range Status  12/14/2018 3.7 3.5 - 5.3 mmol/L Final         Failed - Na in normal range and within 180 days    Sodium  Date Value Ref Range Status  12/14/2018 136 135 - 146 mmol/L Final         Failed - Cr in normal range and within 180 days    Creat  Date Value Ref Range Status  12/14/2018 0.64 0.60 - 1.35 mg/dL Final         Failed - eGFR in normal range and within 180 days    GFR, Est African American  Date Value Ref Range Status  12/14/2018 133 > OR = 60 mL/min/1.13m2 Final   GFR, Est Non African American  Date Value Ref Range Status  12/14/2018 115 > OR = 60 mL/min/1.74m2 Final         Failed - Valid encounter within last 6 months    Recent Outpatient Visits           10 months ago Uncontrolled type 2 diabetes mellitus with hyperglycemia, without long-term current use of insulin [facility])   [place] [facility] Smitty Cords, DO   1 year ago Uncontrolled type 2 diabetes mellitus with hyperglycemia, without long-term current use of insulin [facility])   [place] [facility] S[facility], DO   2 years ago Uncontrolled type 2 diabetes mellitus with hyperglycemia, without long-term current use of insulin [facility])   [place] [facility]  [place], Netta Neat, DO   2 years ago Uncontrolled type 2 diabetes mellitus with hyperglycemia, without long-term current use of insulin [facility])   [place] [facility] [place], Netta Neat, DO   3 years ago Type 2 diabetes mellitus with other specified complication, without long-term current use of insulin [facility])   [place] [facility] [place], Netta Neat, DO              Passed - Last BP in normal range    BP Readings from Last 1 Encounters:  09/09/21 135/80         Passed - Last Heart Rate in normal range    Pulse Readings from Last 1 Encounters:  09/09/21 95

## 2022-07-13 ENCOUNTER — Other Ambulatory Visit: Payer: Self-pay | Admitting: Family Medicine

## 2022-07-13 DIAGNOSIS — I10 Essential (primary) hypertension: Secondary | ICD-10-CM

## 2022-07-15 NOTE — Telephone Encounter (Signed)
Requested medication (s) are due for refill today: Yes  Requested medication (s) are on the active medication list: Yes  Last refill:  06/08/22  Future visit scheduled: Unable to reach pt. To make appointment.  Notes to clinic:  Protocol indicates labs needed.    Requested Prescriptions  Pending Prescriptions Disp Refills   atenolol-chlorthalidone (TENORETIC) 100-25 MG tablet [Pharmacy Med Name: Atenolol-Chlorthalidone 100-25 MG Oral Tablet] 30 tablet 0    Sig: Take 1 tablet by mouth once daily     Cardiovascular: Beta Blocker + Diuretic Combos Failed - 07/13/2022  2:52 PM      Failed - K in normal range and within 180 days    Potassium  Date Value Ref Range Status  12/14/2018 3.7 3.5 - 5.3 mmol/L Final         Failed - Na in normal range and within 180 days    Sodium  Date Value Ref Range Status  12/14/2018 136 135 - 146 mmol/L Final         Failed - Cr in normal range and within 180 days    Creat  Date Value Ref Range Status  12/14/2018 0.64 0.60 - 1.35 mg/dL Final         Failed - eGFR in normal range and within 180 days    GFR, Est African American  Date Value Ref Range Status  12/14/2018 133 > OR = 60 mL/min/1.40m2 Final   GFR, Est Non African American  Date Value Ref Range Status  12/14/2018 115 > OR = 60 mL/min/1.48m2 Final         Failed - Valid encounter within last 6 months    Recent Outpatient Visits           10 months ago Uncontrolled type 2 diabetes mellitus with hyperglycemia, without long-term current use of insulin [facility])   [place] [facility] [place] Smitty Cords, DO   1 year ago Uncontrolled type 2 diabetes mellitus with hyperglycemia, without long-term current use of insulin [facility])   [place] [facility] Smit[facility] Cords, DO   2 years ago Uncontrolled type 2 diabetes mellitus with hyperglycemia, without long-term current use of insulin [facility])   [place] [facility] [place],  Netta Neat, DO   2 years ago Uncontrolled type 2 diabetes mellitus with hyperglycemia, without long-term current use of insulin [facility])   [place] [facility] [place], Netta Neat, DO   3 years ago Type 2 diabetes mellitus with other specified complication, without long-term current use of insulin [facility])   [place] [facility] [place], Netta Neat, DO              Passed - Last BP in normal range    BP Readings from Last 1 Encounters:  09/09/21 135/80         Passed - Last Heart Rate in normal range    Pulse Readings from Last 1 Encounters:  09/09/21 95

## 2022-08-10 ENCOUNTER — Other Ambulatory Visit: Payer: Self-pay | Admitting: Family Medicine

## 2022-08-10 DIAGNOSIS — E1165 Type 2 diabetes mellitus with hyperglycemia: Secondary | ICD-10-CM

## 2022-08-10 DIAGNOSIS — I10 Essential (primary) hypertension: Secondary | ICD-10-CM

## 2022-08-11 NOTE — Telephone Encounter (Signed)
Called pt at both numbers. Neither number is valid.

## 2022-08-11 NOTE — Telephone Encounter (Signed)
Requested medications are due for refill today.  yes  Requested medications are on the active medications list.  yes  Last refill. Metformin 05/28/2021, Tenoretic 07/15/2022   Future visit scheduled.   no  Notes to clinic.  Labs are expired pt is overdue for ov. Called pt at both numbers.     Requested Prescriptions  Pending Prescriptions Disp Refills   metFORMIN (GLUCOPHAGE) 500 MG tablet [Pharmacy Med Name: metFORMIN HCl 500 MG Oral Tablet] 360 tablet 0    Sig: TAKE 2 TABLETS BY MOUTH TWICE DAILY WITH MEALS     Endocrinology:  Diabetes - Biguanides Failed - 08/10/2022 10:43 AM      Failed - Cr in normal range and within 360 days    Creat  Date Value Ref Range Status  12/14/2018 0.64 0.60 - 1.35 mg/dL Final         Failed - HBA1C is between 0 and 7.9 and within 180 days    Hemoglobin A1C  Date Value Ref Range Status  09/09/2021 9.1 (A) 4.0 - 5.6 % Final         Failed - eGFR in normal range and within 360 days    GFR, Est African American  Date Value Ref Range Status  12/14/2018 133 > OR = 60 mL/min/1.51m2 Final   GFR, Est Non African American  Date Value Ref Range Status  12/14/2018 115 > OR = 60 mL/min/1.55m2 Final         Failed - B12 Level in normal range and within 720 days    No results found for: "VITAMINB12"       Failed - Valid encounter within last 6 months    Recent Outpatient Visits           11 months ago Uncontrolled type 2 diabetes mellitus with hyperglycemia, without long-term current use of insulin (HCC)   [place] [facility] Smit[facility] Cords, DO   1 year ago Uncontrolled type 2 diabetes mellitus with hyperglycemia, without long-term current use of insulin (HCC)   [place] [facility] [place], Netta Neat, DO   2 years ago Uncontrolled type 2 diabetes mellitus with hyperglycemia, without long-term current use of insulin (HCC)   [place] [facility] [place], Netta Neat, DO    3 years ago Uncontrolled type 2 diabetes mellitus with hyperglycemia, without long-term current use of insulin (HCC)   [place] [facility] [place], Netta Neat, DO   3 years ago Type 2 diabetes mellitus with other specified complication, without long-term current use of insulin (HCC)   [place] [facility] [place], Alexander J, DO              Failed - CBC within normal limits and completed in the last 12 months    No results found for: "WBC", "WBCKUC" No results found for: "RBC", "RBCKUC" No results found for: "HGB", "HGBKUC", "HGBPOCKUC", "HGBOTHER", "TOTHGB", "HGBPLASMA", "LABHEMOF" No results found for: "HCT", "HCTKUC", "SRHCT" No results found for: "MCHC", "MCHCKUC" No results found for: "MCH", "MCHKUC" No results found for: "MCVKUC", "MCV" No results found for: "PLTCOUNTKUC", "LABPLAT", "POCPLA" No results found for: "RDW", "RDWKUC", "POCRDW"        atenolol-chlorthalidone (TENORETIC) 100-25 MG tablet [Pharmacy Med Name: Atenolol-Chlorthalidone 100-25 MG Oral Tablet] 30 tablet 0    Sig: Take 1 tablet by mouth once daily     Cardiovascular: Beta Blocker + Diuretic Combos Failed - 08/10/2022 10:43 AM  Failed - K in normal range and within 180 days    Potassium  Date Value Ref Range Status  12/14/2018 3.7 3.5 - 5.3 mmol/L Final         Failed - Na in normal range and within 180 days    Sodium  Date Value Ref Range Status  12/14/2018 136 135 - 146 mmol/L Final         Failed - Cr in normal range and within 180 days    Creat  Date Value Ref Range Status  12/14/2018 0.64 0.60 - 1.35 mg/dL Final         Failed - eGFR in normal range and within 180 days    GFR, Est African American  Date Value Ref Range Status  12/14/2018 133 > OR = 60 mL/min/1.1m2 Final   GFR, Est Non African American  Date Value Ref Range Status  12/14/2018 115 > OR = 60 mL/min/1.57m2 Final         Failed - Valid encounter within last 6 months     Recent Outpatient Visits           11 months ago Uncontrolled type 2 diabetes mellitus with hyperglycemia, without long-term current use of insulin [facility])   [place] [facility] [place], Netta Neat, DO   1 year ago Uncontrolled type 2 diabetes mellitus with hyperglycemia, without long-term current use of insulin [facility])   [place] [facility] [place], Netta Neat, DO   2 years ago Uncontrolled type 2 diabetes mellitus with hyperglycemia, without long-term current use of insulin [facility])   [place] [facility] [place], Netta Neat, DO   3 years ago Uncontrolled type 2 diabetes mellitus with hyperglycemia, without long-term current use of insulin [facility])   [place] [facility] [place], Netta Neat, DO   3 years ago Type 2 diabetes mellitus with other specified complication, without long-term current use of insulin [facility])    [facility] [place], Netta Neat, DO              Passed - Last BP in normal range    BP Readings from Last 1 Encounters:  09/09/21 135/80         Passed - Last Heart Rate in normal range    Pulse Readings from Last 1 Encounters:  09/09/21 95

## 2022-08-12 ENCOUNTER — Other Ambulatory Visit: Payer: Self-pay | Admitting: Family Medicine

## 2022-08-12 DIAGNOSIS — I10 Essential (primary) hypertension: Secondary | ICD-10-CM

## 2022-08-12 DIAGNOSIS — E1165 Type 2 diabetes mellitus with hyperglycemia: Secondary | ICD-10-CM

## 2022-08-12 MED ORDER — ATENOLOL-CHLORTHALIDONE 100-25 MG PO TABS: 1.0000 | ORAL_TABLET | Freq: Every day | ORAL | 0 refills | Status: DC

## 2022-08-12 MED ORDER — METFORMIN HCL 500 MG PO TABS: 1000.0000 mg | ORAL_TABLET | Freq: Two times a day (BID) | ORAL | 0 refills | Status: DC

## 2022-08-12 NOTE — Telephone Encounter (Signed)
Medication Refill - Medication: metFORMIN (GLUCOPHAGE) 500 MG tablet and atenolol-chlorthalidone (TENORETIC) 100-25 MG tablet   Has the patient contacted their pharmacy? Yes.   Pt told to contact provider  Preferred Pharmacy (with phone number or street name):  [facility] [address] [place]), [place] - [address] Phone: [phone]  Fax: [phone]     Has the patient been seen for an appointment in the last year OR does the patient have an upcoming appointment? Yes.    Agent: Please be advised that RX refills may take up to 3 business days. We ask that you follow-up with your pharmacy.

## 2022-09-08 ENCOUNTER — Other Ambulatory Visit: Payer: Self-pay | Admitting: Family Medicine

## 2022-09-08 DIAGNOSIS — I10 Essential (primary) hypertension: Secondary | ICD-10-CM

## 2022-09-10 NOTE — Telephone Encounter (Signed)
Requested medications are due for refill today.  yes  Requested medications are on the active medications list.  yes  Last refill. 08/12/2022 #30  0 rf  Future visit scheduled.   yes  Notes to clinic.  Pt has an ov in 6 days. Per protocol I could only refill for 6 days. Please advise.    Requested Prescriptions  Pending Prescriptions Disp Refills   atenolol-chlorthalidone (TENORETIC) 100-25 MG tablet [Pharmacy Med Name: Atenolol-Chlorthalidone 100-25 MG Oral Tablet] 30 tablet 0    Sig: Take 1 tablet by mouth once daily     Cardiovascular: Beta Blocker + Diuretic Combos Failed - 09/08/2022  4:54 PM      Failed - K in normal range and within 180 days    Potassium  Date Value Ref Range Status  12/14/2018 3.7 3.5 - 5.3 mmol/L Final         Failed - Na in normal range and within 180 days    Sodium  Date Value Ref Range Status  12/14/2018 136 135 - 146 mmol/L Final         Failed - Cr in normal range and within 180 days    Creat  Date Value Ref Range Status  12/14/2018 0.64 0.60 - 1.35 mg/dL Final         Failed - eGFR in normal range and within 180 days    GFR, Est African American  Date Value Ref Range Status  12/14/2018 133 > OR = 60 mL/min/1.49m2 Final   GFR, Est Non African American  Date Value Ref Range Status  12/14/2018 115 > OR = 60 mL/min/1.25m2 Final         Failed - Valid encounter within last 6 months    Recent Outpatient Visits           1 year ago Uncontrolled type 2 diabetes mellitus with hyperglycemia, without long-term current use of insulin [facility])   [place] [facility] Smitty Cords, DO   1 year ago Uncontrolled type 2 diabetes mellitus with hyperglycemia, without long-term current use of insulin [facility])   [place] [facility] [place], Netta Neat, DO   2 years ago Uncontrolled type 2 diabetes mellitus with hyperglycemia, without long-term current use of insulin [facility])   [place] [facility] [place], Netta Neat, DO   3 years ago Uncontrolled type 2 diabetes mellitus with hyperglycemia, without long-term current use of insulin [facility])   [place] [facility] [place], Netta Neat, DO   3 years ago Type 2 diabetes mellitus with other specified complication, without long-term current use of insulin [facility])   [place] [facility] [place], Netta Neat, DO       Future Appointments             In 6 days [place], Netta Neat, DO [place] [facility]            Passed - Last BP in normal range    BP Readings from Last 1 Encounters:  09/09/21 135/80         Passed - Last Heart Rate in normal range    Pulse Readings from Last 1 Encounters:  09/09/21 95

## 2022-09-15 ENCOUNTER — Other Ambulatory Visit: Payer: Self-pay | Admitting: Family Medicine

## 2022-09-15 DIAGNOSIS — E1165 Type 2 diabetes mellitus with hyperglycemia: Secondary | ICD-10-CM

## 2022-09-16 ENCOUNTER — Ambulatory Visit: Payer: Medicaid Other | Admitting: Family Medicine

## 2022-09-16 NOTE — Telephone Encounter (Signed)
Requested medication (s) are due for refill today: Yes  Requested medication (s) are on the active medication list: Yes  Last refill:  08/12/22 #120, 0RF  Future visit scheduled: Yes  Notes to clinic:  Unable to refill per protocol, courtesy refill already given, routing for provider approval. Unable to refill per protocol due to failed labs, no updated results.      Requested Prescriptions  Pending Prescriptions Disp Refills   metFORMIN (GLUCOPHAGE) 500 MG tablet [Pharmacy Med Name: metFORMIN HCl 500 MG Oral Tablet] 120 tablet 0    Sig: TAKE 2 TABLETS BY MOUTH TWICE DAILY WITH MEALS     Endocrinology:  Diabetes - Biguanides Failed - 09/15/2022  5:22 PM      Failed - Cr in normal range and within 360 days    Creat  Date Value Ref Range Status  12/14/2018 0.64 0.60 - 1.35 mg/dL Final         Failed - HBA1C is between 0 and 7.9 and within 180 days    Hemoglobin A1C  Date Value Ref Range Status  09/09/2021 9.1 (A) 4.0 - 5.6 % Final         Failed - eGFR in normal range and within 360 days    GFR, Est African American  Date Value Ref Range Status  12/14/2018 133 > OR = 60 mL/min/1.66m2 Final   GFR, Est Non African American  Date Value Ref Range Status  12/14/2018 115 > OR = 60 mL/min/1.55m2 Final         Failed - B12 Level in normal range and within 720 days    No results found for: "VITAMINB12"       Failed - Valid encounter within last 6 months    Recent Outpatient Visits           1 year ago Uncontrolled type 2 diabetes mellitus with hyperglycemia, without long-term current use of insulin (HCC)   [place] [facility] Smitty Cords, DO   1 year ago Uncontrolled type 2 diabetes mellitus with hyperglycemia, without long-term current use of insulin (HCC)   [place] [facility] [place], Netta Neat, DO   2 years ago Uncontrolled type 2 diabetes mellitus with hyperglycemia, without long-term current use of insulin (HCC)    [place] [facility] [place], Netta Neat, DO   3 years ago Uncontrolled type 2 diabetes mellitus with hyperglycemia, without long-term current use of insulin (HCC)   [place] [facility] [place], Netta Neat, DO   3 years ago Type 2 diabetes mellitus with other specified complication, without long-term current use of insulin (HCC)   [place] [facility] [place], Netta Neat, DO       Future Appointments             In 2 weeks [place] Char[place], Netta Neat, DO [place] [facility]            Failed - CBC within normal limits and completed in the last 12 months    No results found for: "WBC", "WBCKUC" No results found for: "RBC", "RBCKUC" No results found for: "HGB", "HGBKUC", "HGBPOCKUC", "HGBOTHER", "TOTHGB", "HGBPLASMA", "LABHEMOF" No results found for: "HCT", "HCTKUC", "SRHCT" No results found for: "MCHC", "MCHCKUC" No results found for: "MCH", "MCHKUC" No results found for: "MCVKUC", "MCV" No results found for: "PLTCOUNTKUC", "LABPLAT", "POCPLA" No results found for: "RDW", "RDWKUC", "POCRDW"

## 2022-10-06 ENCOUNTER — Other Ambulatory Visit: Payer: Self-pay | Admitting: Family Medicine

## 2022-10-06 ENCOUNTER — Ambulatory Visit: Payer: Medicaid Other | Admitting: Family Medicine

## 2022-10-06 DIAGNOSIS — E1165 Type 2 diabetes mellitus with hyperglycemia: Secondary | ICD-10-CM

## 2022-10-06 DIAGNOSIS — I10 Essential (primary) hypertension: Secondary | ICD-10-CM

## 2022-10-06 NOTE — Telephone Encounter (Signed)
Medication Refill - Medication:   Metformin  1000 mg two toimes a day Atenanol 100-25  Has the patient contacted their pharmacy? yes (Agent: If no, request that the patient contact the pharmacy for the refill. If patient does not wish to contact the pharmacy document the reason why and proceed with request.) (Agent: If yes, when and what did the pharmacy advise?)  Preferred Pharmacy (with phone number or street name): [facility]  [address] Has the patient been seen for an appointment in the last year OR does the patient have an upcoming appointment? Yes.    Agent: Please be advised that RX refills may take up to 3 business days. We ask that you follow-up with your pharmacy.

## 2022-10-18 NOTE — Telephone Encounter (Signed)
Requested medication (s) are due for refill today: no  Requested medication (s) are on the active medication list: no  Last refill:  09/10/22  Future visit scheduled: no  Notes to clinic:  Patient is deceased     Requested Prescriptions  Pending Prescriptions Disp Refills   atenolol-chlorthalidone (TENORETIC) 100-25 MG tablet 30 tablet 0    Sig: Take 1 tablet by mouth daily.     Cardiovascular: Beta Blocker + Diuretic Combos Failed - 10/06/2022  3:47 PM      Failed - K in normal range and within 180 days    Potassium  Date Value Ref Range Status  12/14/2018 3.7 3.5 - 5.3 mmol/L Final         Failed - Na in normal range and within 180 days    Sodium  Date Value Ref Range Status  12/14/2018 136 135 - 146 mmol/L Final         Failed - Cr in normal range and within 180 days    Creat  Date Value Ref Range Status  12/14/2018 0.64 0.60 - 1.35 mg/dL Final         Failed - eGFR in normal range and within 180 days    GFR, Est African American  Date Value Ref Range Status  12/14/2018 133 > OR = 60 mL/min/1.30m2 Final   GFR, Est Non African American  Date Value Ref Range Status  12/14/2018 115 > OR = 60 mL/min/1.3m2 Final         Failed - Valid encounter within last 6 months    Recent Outpatient Visits           1 year ago Uncontrolled type 2 diabetes mellitus with hyperglycemia, without long-term current use of insulin (HCC)   [place] [facility] Smitty Cords, DO   1 year ago Uncontrolled type 2 diabetes mellitus with hyperglycemia, without long-term current use of insulin (HCC)   [place] [facility] [place], Netta Neat, DO   2 years ago Uncontrolled type 2 diabetes mellitus with hyperglycemia, without long-term current use of insulin (HCC)   [place] [facility] [place], Netta Neat, DO   3 years ago Uncontrolled type 2 diabetes mellitus with hyperglycemia, without long-term current use of  insulin (HCC)   [place] [facility] [place], Netta Neat, DO   3 years ago Type 2 diabetes mellitus with other specified complication, without long-term current use of insulin (HCC)    [facility] [place], Netta Neat, DO              Passed - Last BP in normal range    BP Readings from Last 1 Encounters:  09/09/21 135/80         Passed - Last Heart Rate in normal range    Pulse Readings from Last 1 Encounters:  09/09/21 95          metFORMIN (GLUCOPHAGE) 500 MG tablet 120 tablet 0    Sig: Take 2 tablets (1,000 mg total) by mouth 2 (two) times daily with a meal.     Endocrinology:  Diabetes - Biguanides Failed - 10/06/2022  3:47 PM      Failed - Cr in normal range and within 360 days    Creat  Date Value Ref Range Status  12/14/2018 0.64 0.60 - 1.35 mg/dL Final         Failed - HBA1C is between 0 and 7.9 and  within 180 days    Hemoglobin A1C  Date Value Ref Range Status  09/09/2021 9.1 (A) 4.0 - 5.6 % Final         Failed - eGFR in normal range and within 360 days    GFR, Est African American  Date Value Ref Range Status  12/14/2018 133 > OR = 60 mL/min/1.92m2 Final   GFR, Est Non African American  Date Value Ref Range Status  12/14/2018 115 > OR = 60 mL/min/1.37m2 Final         Failed - B12 Level in normal range and within 720 days    No results found for: "VITAMINB12"       Failed - Valid encounter within last 6 months    Recent Outpatient Visits           1 year ago Uncontrolled type 2 diabetes mellitus with hyperglycemia, without long-term current use of insulin (HCC)   [place] [facility] [place], Netta Neat, DO   1 year ago Uncontrolled type 2 diabetes mellitus with hyperglycemia, without long-term current use of insulin (HCC)   [place] [facility] [place], Netta Neat, DO   2 years ago Uncontrolled type 2 diabetes mellitus with hyperglycemia, without  long-term current use of insulin (HCC)   [place] [facility] [place], Netta Neat, DO   3 years ago Uncontrolled type 2 diabetes mellitus with hyperglycemia, without long-term current use of insulin (HCC)   [place] [facility] [place], Netta Neat, DO   3 years ago Type 2 diabetes mellitus with other specified complication, without long-term current use of insulin (HCC)   [place] [facility] [place], Alexander J, DO              Failed - CBC within normal limits and completed in the last 12 months    No results found for: "WBC", "WBCKUC" No results found for: "RBC", "RBCKUC" No results found for: "HGB", "HGBKUC", "HGBPOCKUC", "HGBOTHER", "TOTHGB", "HGBPLASMA", "LABHEMOF" No results found for: "HCT", "HCTKUC", "SRHCT" No results found for: "MCHC", "MCHCKUC" No results found for: "MCH", "MCHKUC" No results found for: "MCVKUC", "MCV" No results found for: "PLTCOUNTKUC", "LABPLAT", "POCPLA" No results found for: "RDW", "RDWKUC", "POCRDW"

## 2022-10-18 DEATH — deceased

## 2022-10-27 ENCOUNTER — Ambulatory Visit: Payer: Medicaid Other | Admitting: Family Medicine
